# Patient Record
Sex: Female | Born: 1940 | Race: White | Hispanic: No | Marital: Single | Smoking: Never smoker
Health system: Southern US, Community
[De-identification: ages and names within clinical notes are randomized; demographics above are authoritative.]

## PROBLEM LIST (undated history)

## (undated) DIAGNOSIS — I10 Essential (primary) hypertension: Secondary | ICD-10-CM

## (undated) DIAGNOSIS — M1990 Unspecified osteoarthritis, unspecified site: Secondary | ICD-10-CM

## (undated) DIAGNOSIS — G20 Parkinson's disease: Secondary | ICD-10-CM

## (undated) DIAGNOSIS — K219 Gastro-esophageal reflux disease without esophagitis: Secondary | ICD-10-CM

## (undated) DIAGNOSIS — M47815 Spondylosis without myelopathy or radiculopathy, thoracolumbar region: Secondary | ICD-10-CM

## (undated) HISTORY — DX: Gastro-esophageal reflux disease without esophagitis: K21.9

## (undated) HISTORY — PX: PACEMAKER IMPLANT: EP1218

## (undated) HISTORY — DX: Parkinson's disease: G20

## (undated) HISTORY — DX: Unspecified osteoarthritis, unspecified site: M19.90

## (undated) HISTORY — DX: Spondylosis without myelopathy or radiculopathy, thoracolumbar region: M47.815

## (undated) HISTORY — DX: Essential (primary) hypertension: I10

## (undated) HISTORY — PX: APPENDECTOMY: SHX54

## (undated) HISTORY — PX: COLONOSCOPY: SHX174

## (undated) HISTORY — PX: OTHER SURGICAL HISTORY: SHX169

---

## 2010-02-12 ENCOUNTER — Ambulatory Visit: Payer: Self-pay | Admitting: Cardiology

## 2017-01-06 ENCOUNTER — Encounter (INDEPENDENT_AMBULATORY_CARE_PROVIDER_SITE_OTHER): Payer: Self-pay | Admitting: Internal Medicine

## 2017-01-06 ENCOUNTER — Encounter (INDEPENDENT_AMBULATORY_CARE_PROVIDER_SITE_OTHER): Payer: Self-pay

## 2017-01-16 ENCOUNTER — Encounter (INDEPENDENT_AMBULATORY_CARE_PROVIDER_SITE_OTHER): Payer: Self-pay | Admitting: Internal Medicine

## 2017-01-16 ENCOUNTER — Encounter (INDEPENDENT_AMBULATORY_CARE_PROVIDER_SITE_OTHER): Payer: Self-pay

## 2017-01-16 ENCOUNTER — Encounter (INDEPENDENT_AMBULATORY_CARE_PROVIDER_SITE_OTHER): Payer: Self-pay | Admitting: *Deleted

## 2017-01-16 ENCOUNTER — Ambulatory Visit (INDEPENDENT_AMBULATORY_CARE_PROVIDER_SITE_OTHER): Payer: Medicare Other | Admitting: Internal Medicine

## 2017-01-16 VITALS — BP 140/80 | HR 72 | Temp 97.5°F | Ht 61.0 in | Wt 105.2 lb

## 2017-01-16 DIAGNOSIS — K219 Gastro-esophageal reflux disease without esophagitis: Secondary | ICD-10-CM

## 2017-01-16 DIAGNOSIS — R1013 Epigastric pain: Secondary | ICD-10-CM | POA: Diagnosis not present

## 2017-01-16 DIAGNOSIS — M1990 Unspecified osteoarthritis, unspecified site: Secondary | ICD-10-CM

## 2017-01-16 DIAGNOSIS — G20A1 Parkinson's disease without dyskinesia, without mention of fluctuations: Secondary | ICD-10-CM | POA: Insufficient documentation

## 2017-01-16 DIAGNOSIS — G20 Parkinson's disease: Secondary | ICD-10-CM

## 2017-01-16 DIAGNOSIS — M47815 Spondylosis without myelopathy or radiculopathy, thoracolumbar region: Secondary | ICD-10-CM | POA: Insufficient documentation

## 2017-01-16 DIAGNOSIS — I10 Essential (primary) hypertension: Secondary | ICD-10-CM

## 2017-01-16 DIAGNOSIS — M479 Spondylosis, unspecified: Secondary | ICD-10-CM

## 2017-01-16 DIAGNOSIS — G8929 Other chronic pain: Secondary | ICD-10-CM

## 2017-01-16 HISTORY — DX: Essential (primary) hypertension: I10

## 2017-01-16 HISTORY — DX: Spondylosis, unspecified: M47.9

## 2017-01-16 HISTORY — DX: Unspecified osteoarthritis, unspecified site: M19.90

## 2017-01-16 HISTORY — DX: Parkinson's disease without dyskinesia, without mention of fluctuations: G20.A1

## 2017-01-16 HISTORY — DX: Gastro-esophageal reflux disease without esophagitis: K21.9

## 2017-01-16 HISTORY — DX: Parkinson's disease: G20

## 2017-01-16 MED ORDER — PANTOPRAZOLE SODIUM 40 MG PO TBEC: 40.0000 mg | DELAYED_RELEASE_TABLET | Freq: Every day | ORAL | 1 refills | Status: DC

## 2017-01-16 NOTE — Progress Notes (Addendum)
Subjective:    Patient ID: Linda Martinez, female    DOB: 1940/11/14, 76 y.o.   MRN: [id number]  HPI Referred by Jonathon Bellows DO [facility]) for abdominal pain. She points to her epigastric area.  She says she has been hurting for several year, but has increased  She says it feels like a stabbing pain. The pain is constant. She says she had a EGD at [facility] by Dr. Samuella Cota about 6 months. She had a hiatal hernia. She also underwent a colonoscopy in January of this year. She says if she takes a small dose of xanax her epigastric pain resolves. She has lost about 40 pounds over the past year. She says the function of her GB was normal.  Her appetite is terrible because of the pain. She drinks Boost to prevent further weight loss. She usually has BM every 2 days with MOM.  She says she cannot have a BM if she doesn't take MOM. Daughter tells me she also has been seen at [facility] a couple of years ago for same.  A FIB and maintained on Xarelto. Hx of Parkinson's disease  12/31/2016 TSH 1.26, Vitamin D 34.1, H and H 14.0 and 41.6, MCV 94, [place] ct 209 Total protein 6.9, AST 17, ALP 42, total bili 0.8, ALT 12  11/14/2016  Colonoscopy: Colonoscopy: Periumbilical abdominal pain.:  [facility]: Dr. Vivien Rossetti Internal hemorrhoids that do not  Return to the anal canal, thus continuously prolapsed (Grade 1V). Diverticulosis in the sigmoid and ascending colon. Examined ileum was normal.   04/18/2016 EGD: Dr. Samuella Cota: Hiatal hernia. Antral gastritis and small antral polyp. 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   Ct scan of abdomen: 07/20/2016: Gastric wall appears prominent relative to the degree of distention and correlation relative to the possibility of gastritis is recommended. No acute findings involving the abdomen or pelvis. Bilateral peripelvic cysts.  Peripheral nodular density extending from the lower pole of the left kidney, slightly  larger than the prior exam, and incompletely evaluated.  [place] retroperitoneal aorta B scan limited, 99/22/2017: No hydronephrosis. Bilateral increased echogenicity is consistent with medical renal disease. Peripelvic cyst on the rt. Hypoechoic possibly complex cyst involving the lower pole of the left kidney.   Review of Systems Past Medical History:  Diagnosis Date  . Arthritis 01/16/2017  . Essential hypertension, benign 01/16/2017  . GERD (gastroesophageal reflux disease) 01/16/2017  . 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 Parkinson disease (HCC) 01/16/2017    No past surgical history on file.  Allergies  Allergen Reactions  . Tramadol     Throat swell.s    No current outpatient prescriptions on file prior to visit.   No current facility-administered medications on file prior to visit.    Current Outpatient Prescriptions  Medication Sig Dispense Refill  . ALPRAZolam (XANAX) 0.25 MG tablet Take 0.25 mg by mouth at bedtime as needed for anxiety.    . carbidopa-levodopa (SINEMET IR) 25-100 MG tablet Take 1 tablet by mouth 3 (three) times daily.    Marland Kitchen HYDROcodone-acetaminophen (NORCO/VICODIN) 5-325 MG tablet Take 1 tablet by mouth every 6 (six) hours as needed for moderate pain.    Marland Kitchen losartan (COZAAR) 100 MG tablet Take 50 mg by mouth daily.    . ranitidine (ZANTAC) 150 MG capsule Take 150 mg by mouth 2 (two) times daily.    . rivaroxaban (XARELTO) 20 MG TABS tablet Take 20 mg by mouth daily with supper.    . pantoprazole (  PROTONIX) 40 MG tablet Take 1 tablet (40 mg total) by mouth daily. 60 tablet 1   No current facility-administered medications for this visit.        Objective:   Physical Exam Blood pressure 140/80, pulse 72, temperature 97.5 F (36.4 C), height 5\' 1"  (1.549 m), weight 105 lb 3.2 oz (47.7 kg). Alert and oriented. Skin warm and dry. Oral mucosa is moist.   . Sclera anicteric, conjunctivae is pink. Thyroid not enlarged. No cervical lymphadenopathy. Lungs clear. Heart  regular rate and rhythm.  Abdomen is soft. Bowel sounds are positive. No hepatomegaly. No abdominal masses felt. Epigastric tenderness.  No edema to lower extremities.         Assessment & Plan:  Epigastric pain. Rx for Protonix 40mg  BID. US abdomen. CBC, CMET, amylase, Lipase OV in 3 months Will get EGD from Dr. Samuella CotaPandya and Jillyn Hiddenxrays from her PCP.

## 2017-01-16 NOTE — Patient Instructions (Signed)
Stop the Zantac. Labs, and [place]

## 2017-01-17 ENCOUNTER — Encounter (INDEPENDENT_AMBULATORY_CARE_PROVIDER_SITE_OTHER): Payer: Self-pay

## 2017-01-21 ENCOUNTER — Encounter (INDEPENDENT_AMBULATORY_CARE_PROVIDER_SITE_OTHER): Payer: Self-pay

## 2017-04-21 ENCOUNTER — Encounter (INDEPENDENT_AMBULATORY_CARE_PROVIDER_SITE_OTHER): Payer: Self-pay

## 2017-04-21 ENCOUNTER — Encounter (INDEPENDENT_AMBULATORY_CARE_PROVIDER_SITE_OTHER): Payer: Self-pay | Admitting: Internal Medicine

## 2017-04-21 ENCOUNTER — Encounter (INDEPENDENT_AMBULATORY_CARE_PROVIDER_SITE_OTHER): Payer: Self-pay | Admitting: *Deleted

## 2017-04-21 ENCOUNTER — Ambulatory Visit (INDEPENDENT_AMBULATORY_CARE_PROVIDER_SITE_OTHER): Payer: Medicare Other | Admitting: Internal Medicine

## 2017-04-21 VITALS — BP 132/70 | HR 64 | Temp 97.7°F | Ht 61.0 in | Wt 103.4 lb

## 2017-04-21 DIAGNOSIS — R1310 Dysphagia, unspecified: Secondary | ICD-10-CM | POA: Diagnosis not present

## 2017-04-21 DIAGNOSIS — R1013 Epigastric pain: Secondary | ICD-10-CM | POA: Diagnosis not present

## 2017-04-21 DIAGNOSIS — R1319 Other dysphagia: Secondary | ICD-10-CM

## 2017-04-21 DIAGNOSIS — G8929 Other chronic pain: Secondary | ICD-10-CM

## 2017-04-21 NOTE — Patient Instructions (Addendum)
DG esophagram.  I have advised patient to follow up with Dr. Nolen MuMcKinney.

## 2017-04-21 NOTE — Progress Notes (Addendum)
Subjective:    Patient ID: Linda Martinez, female    DOB: [birth year], 76 y.o.   MRN: [id number]  HPI Here today for f/u. She was last seen in March. She was seen for epigastric pain. Has had this pain for years.  She tells me she has epigastric pain every morning.  If she takes Xanax sometimes the pain will resolve usually.   She has taken Norco for the pain at times. Her last weight in March was 105. Today her weight is 103.4.     Her appetite is good.  She usually has a BM  She takes MOM daily for her constipation.  Is having some dysphagia.   [id number] US abdomen: epigastric pain: Medical renal disease.  No acute abdominal pathology   11/14/2016  Colonoscopy: Colonoscopy: Periumbilical abdominal pain.:  [facility]: Dr. Vivien Rossetti Internal hemorrhoids that do not  Return to the anal canal, thus continuously prolapsed (Grade 1V). Diverticulosis in the sigmoid and ascending colon. Examined ileum was normal.   04/18/2016 EGD: Dr. Samuella Cota: Hiatal hernia. Antral gastritis and small antral polyp. 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   Ct scan of abdomen: 07/20/2016: Gastric wall appears prominent relative to the degree of distention and correlation relative to the possibility of gastritis is recommended. No acute findings involving the abdomen or pelvis. Bilateral peripelvic cysts.  Peripheral nodular density extending from the lower pole of the left kidney, slightly larger than the prior exam, and incompletely evaluated.    03/07/2016 HIDA scan: epigastric pain. Normal.  Hx of atrial fib. Has been taken off Xarelto  Review of Systems Past Medical History:  Diagnosis Date  . Arthritis 01/16/2017  . Essential hypertension, benign 01/16/2017  . GERD (gastroesophageal reflux disease) 01/16/2017  . 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 Parkinson disease (HCC) 01/16/2017    Past Surgical History:  Procedure Laterality Date  . APPENDECTOMY    . c  sections     x 3  . COLONOSCOPY    . goiter surgery    . PACEMAKER IMPLANT    . partial hysterectomuy      Allergies  Allergen Reactions  . Tramadol     Throat swell.s    Current Outpatient Prescriptions on File Prior to Visit  Medication Sig Dispense Refill  . ALPRAZolam (XANAX) 0.25 MG tablet Take 0.25 mg by mouth at bedtime as needed for anxiety.    . carbidopa-levodopa (SINEMET IR) 25-100 MG tablet Take 1 tablet by mouth 3 (three) times daily.    Marland Kitchen HYDROcodone-acetaminophen (NORCO/VICODIN) 5-325 MG tablet Take 1 tablet by mouth every 6 (six) hours as needed for moderate pain.    Marland Kitchen losartan (COZAAR) 100 MG tablet Take 50 mg by mouth daily.    . pantoprazole (PROTONIX) 40 MG tablet Take 1 tablet (40 mg total) by mouth daily. 60 tablet 1  . ranitidine (ZANTAC) 150 MG capsule Take 150 mg by mouth 2 (two) times daily.    . rivaroxaban (XARELTO) 20 MG TABS tablet Take 20 mg by mouth daily with supper.     No current facility-administered medications on file prior to visit.         Objective:   Physical Exam Blood pressure 132/70, pulse 64, temperature 97.7 F (36.5 C), height 5\' 1"  (1.549 m), weight 103 lb 6.4 oz (46.9 kg).   Alert and oriented. Skin warm and dry. Oral mucosa is moist.   . Sclera anicteric, conjunctivae is pink. Thyroid not enlarged. No cervical  lymphadenopathy. Lungs clear. Heart regular rate and rhythm.  Abdomen is soft. Bowel sounds are positive. No hepatomegaly. No abdominal masses felt. No tenderness.  No edema to lower extremities        Assessment & Plan:  Epigastric pain. [facility]

## 2017-04-28 ENCOUNTER — Ambulatory Visit (HOSPITAL_COMMUNITY)
Admission: RE | Admit: 2017-04-28 | Discharge: 2017-04-28 | Disposition: A | Discharge status: Home / Self Care | Payer: Medicare Other | Source: Ambulatory Visit | Attending: Internal Medicine | Admitting: Internal Medicine

## 2017-04-28 DIAGNOSIS — K228 Other specified diseases of esophagus: Secondary | ICD-10-CM | POA: Diagnosis not present

## 2017-04-28 DIAGNOSIS — R1310 Dysphagia, unspecified: Secondary | ICD-10-CM | POA: Diagnosis present

## 2017-04-28 DIAGNOSIS — K222 Esophageal obstruction: Secondary | ICD-10-CM | POA: Insufficient documentation

## 2017-04-28 DIAGNOSIS — R1319 Other dysphagia: Secondary | ICD-10-CM

## 2017-05-08 ENCOUNTER — Telehealth (INDEPENDENT_AMBULATORY_CARE_PROVIDER_SITE_OTHER): Payer: Self-pay | Admitting: Internal Medicine

## 2017-05-08 DIAGNOSIS — E079 Disorder of thyroid, unspecified: Secondary | ICD-10-CM

## 2017-05-08 NOTE — Telephone Encounter (Signed)
Ann, US neck ordered.

## 2017-05-09 NOTE — Telephone Encounter (Signed)
US sch'd 05/19/17 at 115, patient aware

## 2017-05-13 ENCOUNTER — Ambulatory Visit (HOSPITAL_COMMUNITY): Payer: Medicare Other

## 2017-05-19 ENCOUNTER — Ambulatory Visit (HOSPITAL_COMMUNITY): Payer: Medicare Other

## 2017-05-22 ENCOUNTER — Ambulatory Visit (HOSPITAL_COMMUNITY)
Admission: RE | Admit: 2017-05-22 | Discharge: 2017-05-22 | Disposition: A | Discharge status: Home / Self Care | Payer: Medicare Other | Source: Ambulatory Visit | Attending: Internal Medicine | Admitting: Internal Medicine

## 2017-05-22 DIAGNOSIS — E079 Disorder of thyroid, unspecified: Secondary | ICD-10-CM

## 2017-05-22 DIAGNOSIS — E890 Postprocedural hypothyroidism: Secondary | ICD-10-CM | POA: Diagnosis not present

## 2017-05-22 DIAGNOSIS — E042 Nontoxic multinodular goiter: Secondary | ICD-10-CM | POA: Insufficient documentation

## 2019-06-13 IMAGING — US US THYROID
1 series · 13 of 25 positions shown · non-contrast
Comparison: Esophagram 04/28/2017

CLINICAL DATA: Extrinsic mass effect on esophagram. Previous goiter
resection 9 years ago.

EXAM:
THYROID ULTRASOUND
TECHNIQUE: Ultrasound examination of the thyroid gland and adjacent soft
tissues was performed.

[Series 1: us thyroid · 0.06mm/px · 13 of 60 slices shown]
[im 1/60]
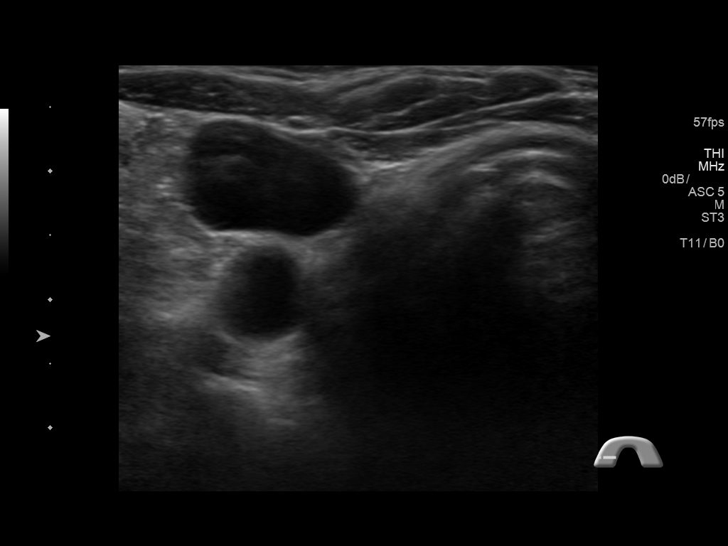
[im 5/60]
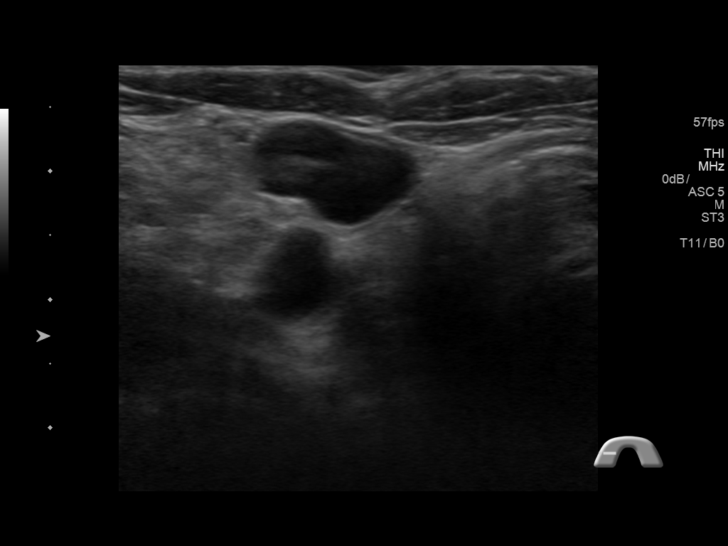
[im 10/60]
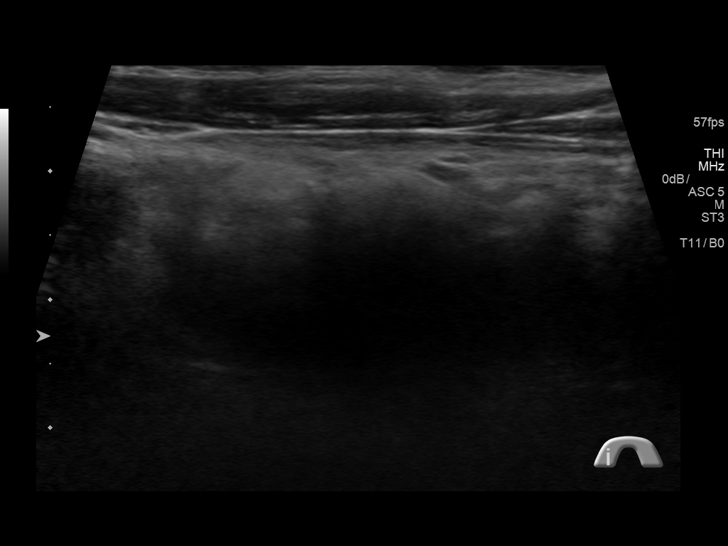
[im 15/60]
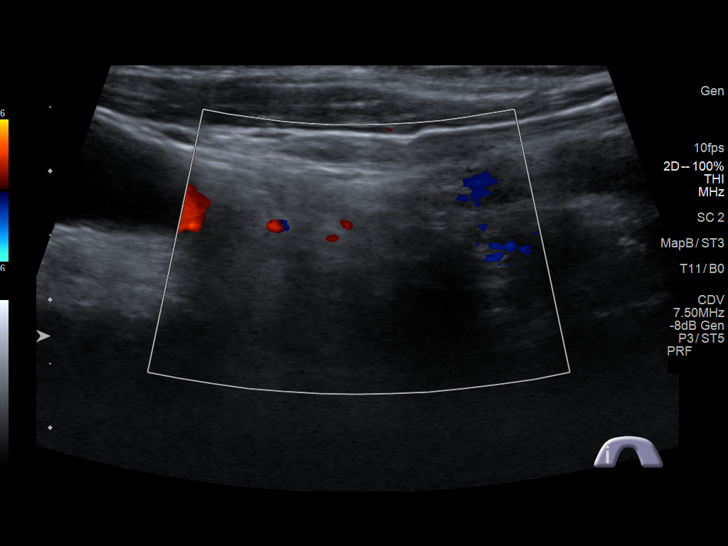
[im 20/60]
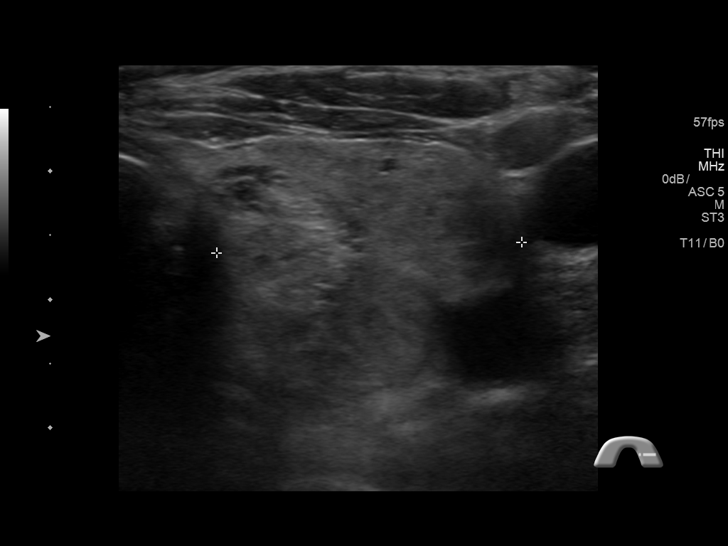
[im 25/60]
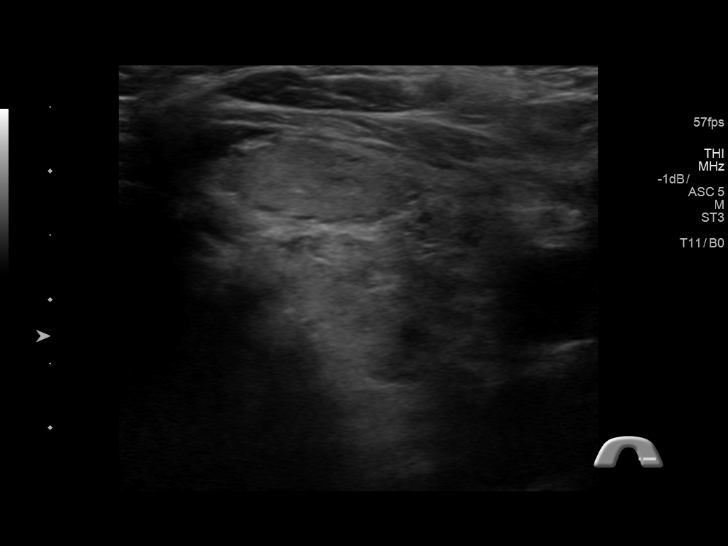
[im 30/60]
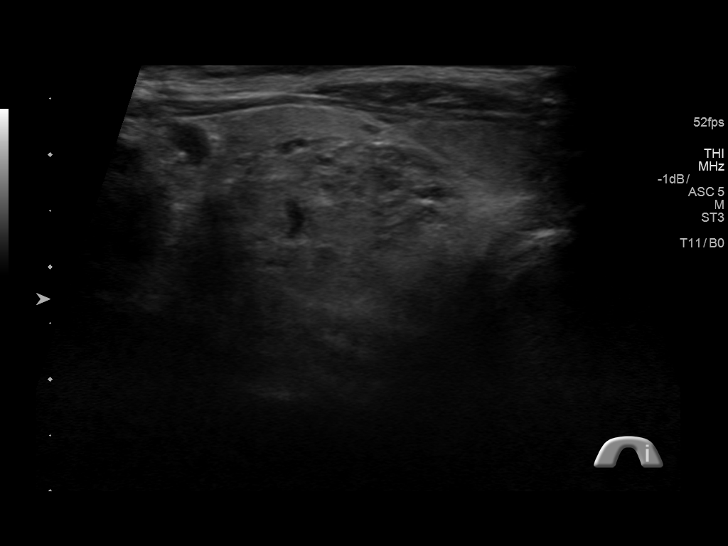
[im 35/60]
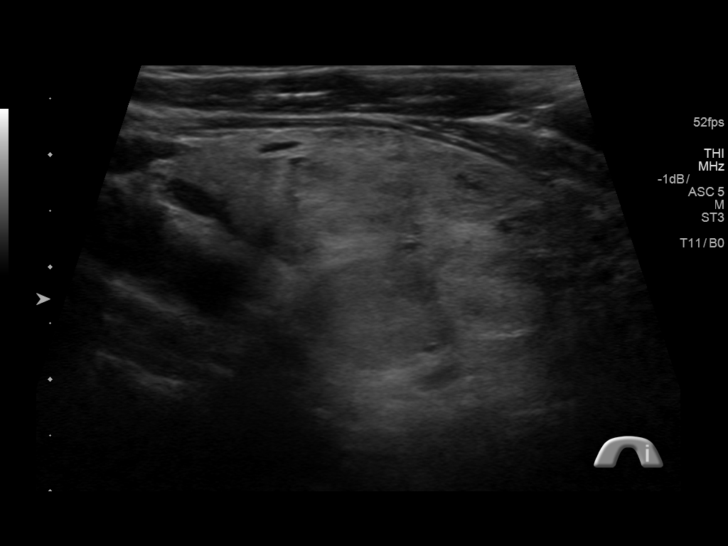
[im 40/60]
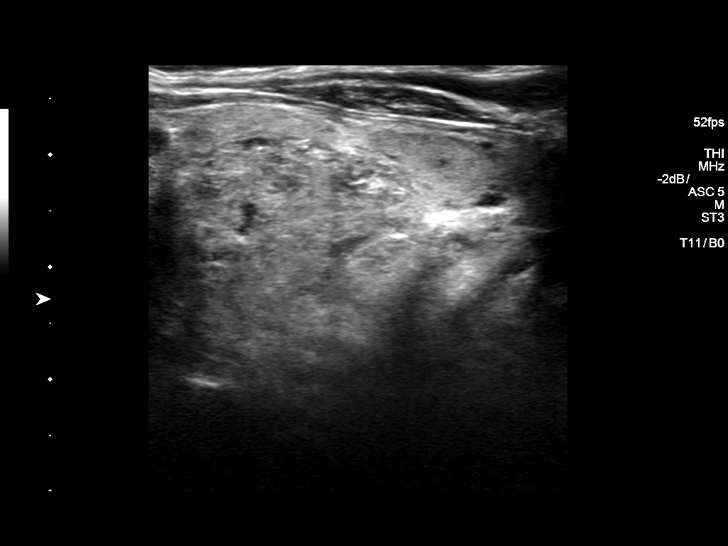
[im 45/60]
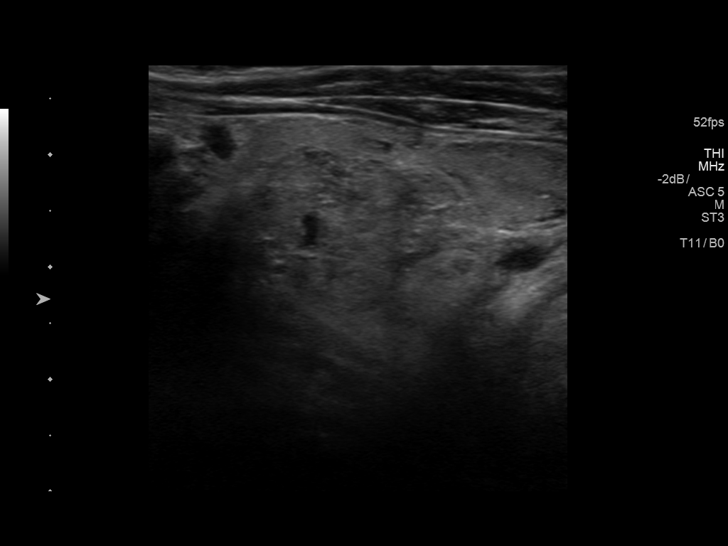
[im 50/60]
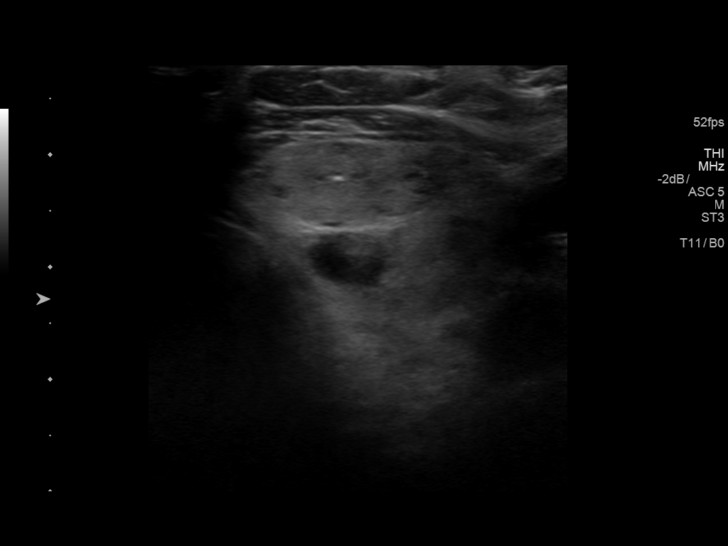
[im 55/60]
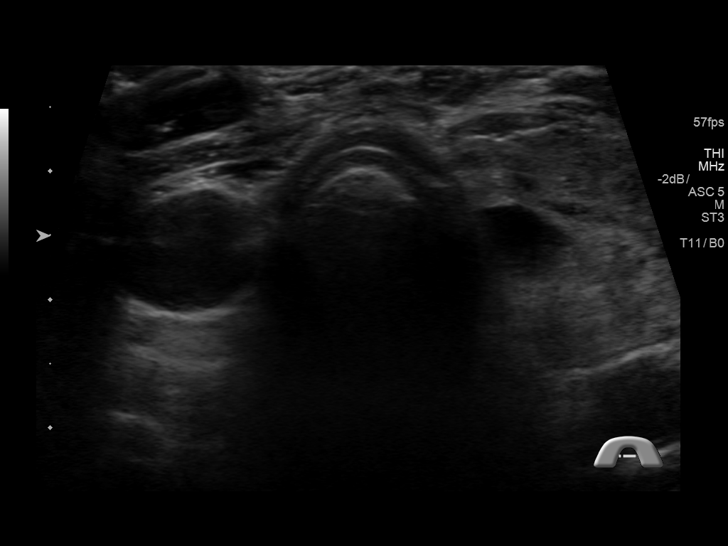
[im 60/60]
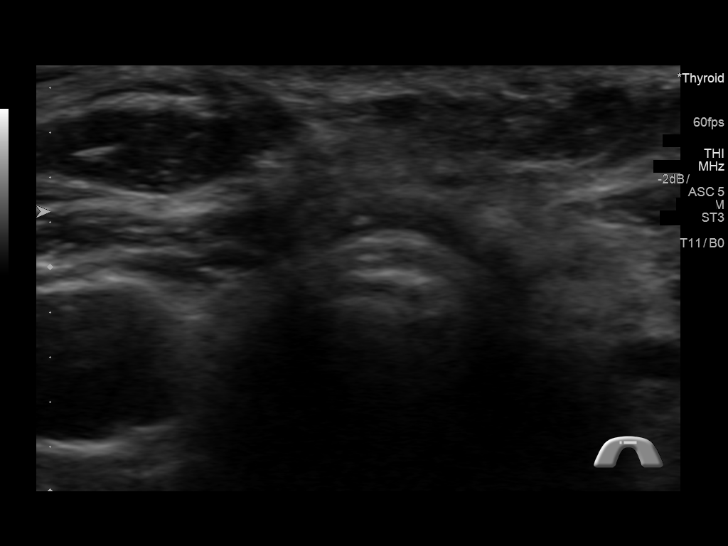

[13 of 25 positions shown; findings below may reference images not displayed]

FINDINGS: Parenchymal Echotexture: Moderately heterogenous

Isthmus: Surgically absent

Right lobe: Surgically absent

Left lobe: 5.2 x 2.7 x 2.4 cm

_________________________________________________________

Estimated total number of nodules >/= 1 cm: 3

Number of spongiform nodules >/=  2 cm not described below (TR1): 0

Number of mixed cystic and solid nodules >/= 1.5 cm not described
below (TR2): 0

_________________________________________________________

Nodule # 1:

Location: Left; Mid

Maximum size: 2 cm; Other 2 dimensions: 1.4 x 0.7 cm

Composition: solid/almost completely solid (2)

Echogenicity: isoechoic (1)

Shape: not taller-than-wide (0)

Margins: ill-defined (0)

Echogenic foci: macrocalcifications (1)

ACR TI-RADS total points: 4.

ACR TI-RADS risk category: TR4 (4-6 points).

ACR TI-RADS recommendations:

**Given size (>/= 1.5 cm) and appearance, fine needle aspiration of
this moderately suspicious nodule should be considered based on
TI-RADS criteria.

_________________________________________________________

Nodule # 2:

Location: Left; Superior

Maximum size: 1.6 cm; Other 2 dimensions: 1.6 x 1 cm

Composition: solid/almost completely solid (2)

Echogenicity: isoechoic (1)

Shape: taller-than-wide (3)

Margins: ill-defined (0)

Echogenic foci: none (0)

ACR TI-RADS total points: 6.

ACR TI-RADS risk category: TR4 (4-6 points).

ACR TI-RADS recommendations:

**Given size (>/= 1.5 cm) and appearance, fine needle aspiration of
this moderately suspicious nodule should be considered based on
TI-RADS criteria.

_________________________________________________________

Nodule # 3:

Location: Left; Mid

Maximum size: 1.5 cm; Other 2 dimensions: 1.2 x 1 cm

Composition: solid/almost completely solid (2)

Echogenicity: isoechoic (1)

Shape: not taller-than-wide (0)

Margins: ill-defined (0)

Echogenic foci: none (0)

ACR TI-RADS total points: 3.

ACR TI-RADS risk category: TR3 (3 points).

ACR TI-RADS recommendations:

*Given size (>/= 1.5 - 2.4 cm) and appearance, a follow-up
ultrasound in 1 year should be considered based on TI-RADS criteria.

_________________________________________________________
IMPRESSION: 1. No residual/recurrent tissue post right hemithyroidectomy.
2. Enlarged left thyroid lobe with nodules.
3. Recommend FNA biopsy of moderately suspicious 2 cm inferior and
1.6 cm superior nodules.
4. Assuming negative cytology, recommend 1 year follow-up ultrasound
of left mid lesion.

The above is in keeping with the ACR TI-RADS recommendations - [HOSPITAL] 8581;[DATE].

## 2021-06-26 ENCOUNTER — Encounter (INDEPENDENT_AMBULATORY_CARE_PROVIDER_SITE_OTHER): Payer: Self-pay | Admitting: *Deleted

## 2021-11-08 ENCOUNTER — Other Ambulatory Visit: Payer: Self-pay

## 2021-11-08 ENCOUNTER — Ambulatory Visit (INDEPENDENT_AMBULATORY_CARE_PROVIDER_SITE_OTHER): Payer: Medicare Other | Admitting: Gastroenterology

## 2021-11-08 ENCOUNTER — Encounter (INDEPENDENT_AMBULATORY_CARE_PROVIDER_SITE_OTHER): Payer: Self-pay | Admitting: Gastroenterology

## 2021-11-08 DIAGNOSIS — G8929 Other chronic pain: Secondary | ICD-10-CM | POA: Diagnosis not present

## 2021-11-08 DIAGNOSIS — R140 Abdominal distension (gaseous): Secondary | ICD-10-CM

## 2021-11-08 DIAGNOSIS — K589 Irritable bowel syndrome without diarrhea: Secondary | ICD-10-CM | POA: Insufficient documentation

## 2021-11-08 DIAGNOSIS — R1013 Epigastric pain: Secondary | ICD-10-CM

## 2021-11-08 DIAGNOSIS — K581 Irritable bowel syndrome with constipation: Secondary | ICD-10-CM

## 2021-11-08 NOTE — Patient Instructions (Addendum)
Schedule CT angio abdomen/pelvis with IV contrast Please bring report of most recent gastric emptying study Continue Miralax for constipation

## 2021-11-08 NOTE — Progress Notes (Signed)
Linda Martinez, M.D. Gastroenterology & Hepatology [facility] [address] [place], [place] [address] Primary Care Physician: Jonathon Bellows, DO [address] [place] [address]  Referring MD: PCP  Chief Complaint: Abdominal bloating and pain  History of Present Illness: Linda Martinez is a 80 y.o. female with PMH Parkinson's disease, GERD, HTN, who presents for evaluation of abdominal bloating and pain.  Patinet reports that for multiple years (at least 40 years), she has presented issues with upper abdominal discomfort and recurrent epigastric distention. She reports that she has noticed the symptoms are worse recently. She states that after having a small meal, she feels that the food stays in her upper abdomen for long time and she gets bloated very easily.  Patient comes to the visit with her daughter who is very concerned about this as her mother is eating much less than she used to in the past.  She reports that she is not eating too much as it causes significant bloating. She has tried avoiding "gassy food" without improvement. She also takes Gas-X frequently without significant relief. Also reports having some pain in the epigastric area after eating. She was prescribed sucralfate in the past but she could not tolerate it.  Due to the symptoms, she has been seen by multiple gastroenterologist at [place], [facility] and at [facility].  Both the daughter and the patient are frustrated as no diagnosis has been attributed to her symptoms for multiple years.  She was actually seen in our office in 2018 due to similar complaints.  She has also presented a chronic history of constipation. She takes Miralax 2 days in a row and skips a day, which helps her have bowel movements every 1-2 days  The patient denies having any nausea, vomiting, fever, chills, hematochezia, melena, hematemesis, diarrhea, jaundice, pruritus.Lost a significant amount of weight  in the past, but has gained it. Daughter states it is related to poor oral intake as it causes pain.  She was scheduled to have a gastric emptying study at [facility] but could not make it as it was a long ride. The daughter reports that she had a GES possibly at  [place] but no reports are available. Is not sure if she took Xifaxan in the past -this was prescribed at [facility] when she saw Dr. Peggye Form in 2020 but there is no documentation whether she took it or not.  Patient takes 1/2 tablet of hydrocodone to 1 pill a day for abdominal pain. She has been taking it for the abdominal pain but not prior to this.  Last EGD: 08/03/2020, performed at [facility] by Dr. 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 Last Colonoscopy: 11/14/2016  Colonoscopy: Colonoscopy: Periumbilical abdominal pain.:  [facility]: Dr. Vivien Rossetti Internal hemorrhoids that do not  Return to the anal canal, thus continuously prolapsed (Grade 1V). Diverticulosis in the sigmoid and ascending colon. Examined ileum was normal.   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 No pertinent family history.  Social  History: Social History   Tobacco Use  Smoking Status Never  Smokeless Tobacco Never   Social History   Substance and Sexual Activity  Alcohol Use No   Social History   Substance and Sexual Activity  Drug Use Never    Allergies: Allergies  Allergen Reactions   Tramadol     Throat swell.s    Medications: Current Outpatient Medications  Medication Sig Dispense Refill   ALPRAZolam (XANAX) 0.25 MG tablet Take 0.25 mg by mouth at bedtime as needed for anxiety.     carbidopa-levodopa (SINEMET IR) 25-100 MG tablet Take 1 tablet by mouth 3 (three) times daily.     HYDROcodone-acetaminophen (NORCO/VICODIN) 5-325 MG tablet Take 1 tablet by mouth every 6 (six) hours as needed for moderate pain.     lisinopril (ZESTRIL) 2.5 MG tablet Take 5 mg by mouth daily.     losartan (COZAAR) 100 MG tablet Take 50 mg by mouth daily.     magnesium hydroxide (MILK OF MAGNESIA) 400 MG/5ML suspension Take 15 mLs by mouth daily as needed for mild constipation.     mirtazapine (REMERON) 15 MG tablet Take 15 mg by mouth at bedtime.     Multiple Vitamins-Minerals (MULTIVITAMIN WITH MINERALS) tablet Take 1 tablet by mouth daily.     polyethylene glycol (MIRALAX / GLYCOLAX) 17 g packet Take 17 g by mouth daily.     prednisoLONE 5 MG TABS tablet Take 5 mg by mouth daily.     Probiotic Product (PROBIOTIC ADVANCED PO) Take by mouth. 1 Gummie daily.     rosuvastatin (CRESTOR) 20 MG tablet Take 20 mg by mouth daily.     sennosides-docusate sodium (SENOKOT-S) 8.6-50 MG tablet Take 1 tablet by mouth daily.     simethicone (MYLICON) 125 MG chewable tablet Chew 125 mg by mouth every 6 (six) hours as needed for flatulence.     No current facility-administered medications for this visit.    Review of Systems: GENERAL: negative for malaise, night sweats HEENT: No changes in hearing or vision, no nose bleeds or other nasal problems. NECK: Negative for lumps, goiter, pain and significant neck swelling RESPIRATORY:  Negative for cough, wheezing CARDIOVASCULAR: Negative for chest pain, leg swelling, palpitations, orthopnea GI: SEE HPI MUSCULOSKELETAL: Negative for joint pain or swelling, back pain, and muscle pain. SKIN: Negative for lesions, rash PSYCH: Negative for sleep disturbance, mood disorder and recent psychosocial stressors. HEMATOLOGY Negative for prolonged bleeding, bruising easily, and swollen nodes. ENDOCRINE: Negative for cold or heat intolerance, polyuria, polydipsia and goiter. NEURO: negative for tremor, gait imbalance, syncope and seizures. The remainder of the review of systems is noncontributory.   Physical Exam: BP 98/65 (BP Location: Left Arm, Patient Position: Sitting, Cuff Size: Small)    Pulse 86    Temp 97.8 F (36.6 C) (Oral)    Ht 5' (1.524 m)    Wt 104 lb 1.6 oz (47.2 kg)    BMI 20.33 kg/m  GENERAL: The patient is AO x3, in no acute distress. Frail. HEENT: Head is normocephalic and atraumatic. EOMI are intact. Mouth is well hydrated and without lesions. NECK: Supple. No  masses LUNGS: Clear to auscultation. No presence of rhonchi/wheezing/rales. Adequate chest expansion HEART: RRR, normal s1 and s2. ABDOMEN: Soft, nontender, no guarding, no peritoneal signs, and nondistended. BS +. No masses. EXTREMITIES: Without any cyanosis, clubbing, rash, lesions or edema. NEUROLOGIC: AOx3, no focal motor deficit. SKIN: no jaundice, no rashes   Imaging/Labs: as above  I personally reviewed and interpreted the available labs, imaging and endoscopic files.  Impression and Plan: Linda Martinez is a 80 y.o. female with PMH Parkinson's disease, GERD, HTN, who presents for evaluation of abdominal bloating and pain.  The patient has presented chronic abdominal distention and pain for multiple decades.  She has had endoscopic investigations in the past that have been unremarkable.  Even though she has lost a significant amount of weight it has remained relatively stable compared to her  weight back in 2018.  I had a thorough discussion with the daughter and the patient about the possibility that her symptoms are related to gastroparesis or irritable bowel syndrome.  I explained that I really wish to evaluate this further with a gastric emptying study of opiates, but the daughter stated that she would like to bring the report of her prior gastric emptying study before we commit to repeat this which I find reasonable.  Also as her symptoms are worse after eating meals, we will need to rule out chronic mesenteric ischemia with a CT angio of the abdomen and pelvis with IV contrast.  Patient understood and agreed.  For now, she will benefit from continuing the use of MiraLAX to relieve her constipation.  - Schedule CT angio abdomen/pelvis with IV contrast - Patient to bring report of most recent gastric emptying study - Continue Miralax for constipation  All questions were answered.      Linda Blazing, MD Gastroenterology and Hepatology [facility]

## 2021-11-09 ENCOUNTER — Encounter (INDEPENDENT_AMBULATORY_CARE_PROVIDER_SITE_OTHER): Payer: Self-pay

## 2021-11-23 ENCOUNTER — Telehealth (INDEPENDENT_AMBULATORY_CARE_PROVIDER_SITE_OTHER): Payer: Self-pay

## 2021-11-23 NOTE — Telephone Encounter (Signed)
error 

## 2021-11-30 ENCOUNTER — Other Ambulatory Visit (INDEPENDENT_AMBULATORY_CARE_PROVIDER_SITE_OTHER): Payer: Self-pay | Admitting: Gastroenterology

## 2021-11-30 DIAGNOSIS — K5904 Chronic idiopathic constipation: Secondary | ICD-10-CM

## 2021-11-30 MED ORDER — LINACLOTIDE 145 MCG PO CAPS: 145.0000 ug | ORAL_CAPSULE | Freq: Every day | ORAL | 3 refills | Status: DC

## 2021-11-30 NOTE — Progress Notes (Signed)
I called the patient's daughter to inform about the results of recent CT of the abdomen and pelvis with IV contrast performed at [facility].  This scan showed presence of large amount of fecal retention but no other abnormalities.  I will prescribe her Linzess 145 micrograms every day.  The patient daughter understood and agreed.  Maylon Peppers, MD Gastroenterology and Hepatology [facility]

## 2021-12-03 ENCOUNTER — Encounter (INDEPENDENT_AMBULATORY_CARE_PROVIDER_SITE_OTHER): Payer: Self-pay

## 2022-02-18 ENCOUNTER — Encounter (INDEPENDENT_AMBULATORY_CARE_PROVIDER_SITE_OTHER): Payer: Self-pay | Admitting: Gastroenterology

## 2022-02-18 ENCOUNTER — Ambulatory Visit (INDEPENDENT_AMBULATORY_CARE_PROVIDER_SITE_OTHER): Payer: Medicare PPO | Admitting: Gastroenterology

## 2022-02-18 VITALS — BP 100/55 | HR 62 | Temp 97.9°F | Ht 60.0 in | Wt 106.7 lb

## 2022-02-18 DIAGNOSIS — R1013 Epigastric pain: Secondary | ICD-10-CM

## 2022-02-18 DIAGNOSIS — K581 Irritable bowel syndrome with constipation: Secondary | ICD-10-CM | POA: Diagnosis not present

## 2022-02-18 NOTE — Progress Notes (Signed)
Linda Martinez, M.D. ?Gastroenterology & Hepatology ?[facility] ?[address] ?[place], [place] [address] ? ?Primary Care Physician: ?Jonathon Bellows, DO ?[address] ?[place] [address] ? ?I will communicate my assessment and recommendations to the referring MD via EMR. ? ?Problems: ?Postprandial abdominal pain ?IBS-C ? ?History of Present Illness: ?Linda Martinez is a 81 y.o. female with PMH Parkinson's disease, GERD, HTN, IBS-C, who presents for follow up of abdominal pain and constipation. ? ?The patient was last seen on 11/08/2021. At that time, the patient was scheduled for a CT angio abdomen and pelvis with IV contrast.  She was also requested to bring the report of the most recent gastric emptying study (I never received this result).  She was also advised to continue taking MiraLAX for constipation. ? ?3 weeks ago she had a fall and had trauma in her arm and had to be hospitalized for this.  Since then, she has been seen in a wheelchair and has been affected in terms of musculoskeletal pain. ? ?Patient had a CT of the abdomen pelvis with IV contrast performed on 11/29/2021 at an outside facility (report of the CT is scanned in the medical chart, no films are available).  This was not an angio protocol.  She had presence of a large amount of fecal retention in her colon but no other alterations. ? ?She is currently taking Linzess 145 mcg qday and Miralax 1 capful every day. She was off the Linzess while she was in the nursing home but she is back on it. She is having one BM every day with this regimen. She is not eating at the moment as she is fearful to have pain after eating.  States that she has pain throughout the day but this is very severe after eating any meal.  Her daughter is concerned that she has been avoiding food intake due to the pain.  She feels bloated frequently. She was on protein supplements but these were stopped at the nursing home as  there was a concern for constipation.  ? ?The patient denies having any nausea, vomiting, fever, chills, hematochezia, melena, hematemesis, jaundice, pruritus. Believes she has been losing weight but weight is similar to her previous appointment. ? ?Last EGD: 08/03/2020, performed at [facility] by Dr. 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 ?Last Colonoscopy: 11/14/2016  for evaluation of Periumbilical abdominal pain.: ? [facility]: Dr. Vivien Rossetti ?Internal hemorrhoids that do not  Return to the anal canal, thus continuously prolapsed (Grade 1V). Diverticulosis in the sigmoid and ascending colon. Examined ileum was normal.  ? ?Past Medical History: ?Past Medical History:  ?Diagnosis Date  ? Arthritis 01/16/2017  ? Essential hypertension, benign 01/16/2017  ? GERD (gastroesophageal reflux disease) 01/16/2017  ? 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 Parkinson disease (HCC) 01/16/2017  ? ? ?Past Surgical History: ?Past Surgical History:  ?Procedure Laterality Date  ? APPENDECTOMY    ? c sections    ? x 3  ? COLONOSCOPY    ? goiter surgery    ? PACEMAKER IMPLANT    ? partial hysterectomuy    ? ? ?Family History:No family history on file. ? ?Social History: ?Social History  ? ?Tobacco Use  ?Smoking Status Never  ? Passive exposure: Never  ?Smokeless Tobacco Never  ? ?Social History  ? ?  Substance and Sexual Activity  ?Alcohol Use No  ? ?Social History  ? ?Substance and Sexual Activity  ?Drug Use Never  ? ? ?Allergies: ?Allergies  ?Allergen Reactions  ? Tramadol   ?  Throat swell.s  ? ? ?Medications: ?Current Outpatient Medications  ?Medication Sig Dispense Refill  ? ALPRAZolam (XANAX) 0.25 MG tablet Take 0.25 mg by mouth at bedtime as needed for anxiety.    ? carbidopa-levodopa (SINEMET IR) 25-100 MG tablet Take 1 tablet by mouth 3 (three) times daily.    ?  HYDROcodone-acetaminophen (NORCO/VICODIN) 5-325 MG tablet Take 1 tablet by mouth every 6 (six) hours as needed for moderate pain.    ? linaclotide (LINZESS) 145 MCG CAPS capsule Take 1 capsule (145 mcg total) by mouth daily before breakfast. 90 capsule 3  ? lisinopril (ZESTRIL) 2.5 MG tablet Take 2.5 mg by mouth daily.    ? magnesium hydroxide (MILK OF MAGNESIA) 400 MG/5ML suspension Take 15 mLs by mouth daily as needed for mild constipation.    ? Multiple Vitamins-Minerals (MULTIVITAMIN WITH MINERALS) tablet Take 1 tablet by mouth daily.    ? polyethylene glycol (MIRALAX / GLYCOLAX) 17 g packet Take 17 g by mouth daily.    ? rosuvastatin (CRESTOR) 20 MG tablet Take 20 mg by mouth daily.    ? simethicone (MYLICON) 125 MG chewable tablet Chew 125 mg by mouth every 6 (six) hours as needed for flatulence.    ? losartan (COZAAR) 100 MG tablet Take 50 mg by mouth daily. (Patient not taking: Reported on 02/18/2022)    ? mirtazapine (REMERON) 15 MG tablet Take 15 mg by mouth at bedtime. (Patient not taking: Reported on 02/18/2022)    ? prednisoLONE 5 MG TABS tablet Take 5 mg by mouth daily. (Patient not taking: Reported on 02/18/2022)    ? Probiotic Product (PROBIOTIC ADVANCED PO) Take by mouth. 1 Gummie daily. (Patient not taking: Reported on 02/18/2022)    ? sennosides-docusate sodium (SENOKOT-S) 8.6-50 MG tablet Take 1 tablet by mouth daily. (Patient not taking: Reported on 02/18/2022)    ? ?No current facility-administered medications for this visit.  ? ? ?Review of Systems: ?GENERAL: negative for malaise, night sweats ?HEENT: No changes in hearing or vision, no nose bleeds or other nasal problems. ?NECK: Negative for lumps, goiter, pain and significant neck swelling ?RESPIRATORY: Negative for cough, wheezing ?CARDIOVASCULAR: Negative for chest pain, leg swelling, palpitations, orthopnea ?GI: SEE HPI ?MUSCULOSKELETAL: Negative for joint pain or swelling, back pain, and muscle pain. ?SKIN: Negative for lesions, rash ?PSYCH:  Negative for sleep disturbance, mood disorder and recent psychosocial stressors. ?HEMATOLOGY Negative for prolonged bleeding, bruising easily, and swollen nodes. ?ENDOCRINE: Negative for cold or heat intolerance, polyuria, polydipsia and goiter. ?NEURO: negative for tremor, gait imbalance, syncope and seizures. ?The remainder of the review of systems is noncontributory. ? ? ?Physical Exam: ?BP (!) 100/55 (BP Location: Right Arm, Patient Position: Sitting, Cuff Size: Normal)   Pulse 62   Temp 97.9 ?F (36.6 ?C) (Oral)   Ht 5' (1.524 m)   Wt 106 lb 11.2 oz (48.4 kg)   BMI 20.84 kg/m?  ?GENERAL: The patient is AO x3, in no acute distress. Sitting in wheelchair ?HEENT: Head is normocephalic and atraumatic. EOMI are intact. Mouth is well hydrated and without lesions. ?NECK: Supple. No masses ?LUNGS: Clear to auscultation. No presence of rhonchi/wheezing/rales. Adequate chest expansion ?HEART: RRR, normal s1 and s2. ?ABDOMEN: mildly tender in the epigastric area, no guarding, no peritoneal signs, and nondistended. BS +. No  masses. ?EXTREMITIES: Without any cyanosis, clubbing, rash, lesions or edema. ?NEUROLOGIC: AOx3, no focal motor deficit. ?SKIN: no jaundice, no rashes ? ?Imaging/Labs: ?as above ? ?I personally reviewed and interpreted the available labs, imaging and endoscopic files. ? ?Impression and Plan: ?Linda Martinez is a 81 y.o. female with PMH Parkinson's disease, GERD, HTN, IBS-C, who presents for follow up of abdominal pain and constipation.  The patient has presented persistent constipation since the last visit.  It is concerning that she has been avoiding food intake due to this.  Unfortunately, even though I ordered a CT angio in her last appointment, the imaging performed at an outside facility was a regular CT with IV contrast.  This will not help evaluating for chronic mesenteric ischemia.  I discussed this with the patient and the daughter and reinforced the importance of proceeding with this  specific imaging test.  The understood and agreed to repeat the CT scan.  She we will need to bring the report of the gastric emptying study as this has not been available for me to review.  If these investigations are [place]

## 2022-02-18 NOTE — Patient Instructions (Addendum)
Schedule CT angio abdomen/pelvis with IV contrast ?Continue Linzess 145 mcg qday ?Continue Miralax daily ?May consider EGD if unremarkable CT ?

## 2022-03-07 ENCOUNTER — Telehealth (INDEPENDENT_AMBULATORY_CARE_PROVIDER_SITE_OTHER): Payer: Self-pay

## 2022-03-07 NOTE — Telephone Encounter (Signed)
Daughter aware and plans on bringing the film on Mar 11, 2022. ?

## 2022-03-07 NOTE — Telephone Encounter (Signed)
Had CT angio done 03/06/2022 at [facility] has the disc and does not want to just drive it up here without an appointment please advise. Can you see the results through Care everywhere? ?

## 2022-03-07 NOTE — Telephone Encounter (Signed)
Unfortunately I cannot access neither the report nor the imaging films.  I will need both for further evaluation of her symptoms ?

## 2022-03-20 ENCOUNTER — Telehealth (INDEPENDENT_AMBULATORY_CARE_PROVIDER_SITE_OTHER): Payer: Self-pay

## 2022-03-20 NOTE — Telephone Encounter (Signed)
Patient was told to keep the 05/20/2022 appt and cancel the 03/21/2022 appt with [facility] [place]. Patient daughter aware. ?

## 2022-03-20 NOTE — Telephone Encounter (Signed)
I reviewed the imaging of CT angio of the abdomen and pelvis with IV contrast with Dr. 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  I explained to the daughter that she should follow-up with GYN.   ?As she is presenting significant persistent pain, we may proceed with an EGD and colonoscopy.  However the daughter stated that she is not interested in proceeding with this at this moment I will hold off for now.  She would like to keep the July appointment for now. ?

## 2022-03-20 NOTE — Telephone Encounter (Signed)
Daughter Lebron Conners [phone] called today says her mother is still having the same abdominal pain and they understand they found nothing on the CT scan. They want to know what the next steps are. They had an appointment set up for 05/20/2022,but wanted to be seen sooner. We had an opening for 03/20/2022 at 1:45 pm and they took that time slot. She also still has the ct scan scheduled on 04/01/2022, does the patient need to still get this done? Please advise.  ?

## 2022-03-21 ENCOUNTER — Ambulatory Visit (INDEPENDENT_AMBULATORY_CARE_PROVIDER_SITE_OTHER): Payer: Medicare PPO | Admitting: Gastroenterology

## 2022-04-01 ENCOUNTER — Ambulatory Visit (HOSPITAL_COMMUNITY): Payer: Medicare PPO

## 2022-05-20 ENCOUNTER — Ambulatory Visit (INDEPENDENT_AMBULATORY_CARE_PROVIDER_SITE_OTHER): Payer: Medicare PPO | Admitting: Gastroenterology

## 2022-05-20 ENCOUNTER — Encounter (INDEPENDENT_AMBULATORY_CARE_PROVIDER_SITE_OTHER): Payer: Self-pay | Admitting: Gastroenterology

## 2022-05-20 VITALS — BP 152/97 | HR 75 | Ht 60.0 in | Wt 97.4 lb

## 2022-05-20 DIAGNOSIS — R1013 Epigastric pain: Secondary | ICD-10-CM

## 2022-05-20 DIAGNOSIS — R140 Abdominal distension (gaseous): Secondary | ICD-10-CM | POA: Diagnosis not present

## 2022-05-20 DIAGNOSIS — G20 Parkinson's disease: Secondary | ICD-10-CM

## 2022-05-20 DIAGNOSIS — K581 Irritable bowel syndrome with constipation: Secondary | ICD-10-CM | POA: Diagnosis not present

## 2022-05-20 MED ORDER — LINACLOTIDE 290 MCG PO CAPS
290.0000 ug | ORAL_CAPSULE | Freq: Every day | ORAL | 3 refills | Status: DC
Start: 2022-05-20 — End: 2022-12-02

## 2022-05-20 NOTE — Patient Instructions (Addendum)
Increase Linzess 290 mcg qday Can take Miralax as needed if still constipated despite taking Linzess Stop peptobismol and milk of magnesia Take Gas-X as needed Liberalize diet and take Boost or Ensure supplements at least twice a day

## 2022-05-20 NOTE — Progress Notes (Signed)
Katrinka Blazing, M.D. Gastroenterology & Hepatology [facility] [address] [place], [place] [address]  Primary Care Physician: Jonathon Bellows, DO [address] [place] [address]  I will communicate my assessment and recommendations to the referring MD via EMR.  Problems: Postprandial abdominal pain IBS-C  History of Present Illness: Linda Martinez is a 81 y.o.  female with PMH Parkinson's disease, GERD, HTN, IBS-C, who presents for follow up of abdominal pain and constipation.  The patient was last seen on 02/18/2022. At that time, the patient was scheduled to undergo a CT angio of the abdomen and pelvis with IV contrast.  She was continued on Linzess 145 mcg every day and MiraLAX daily. 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  I advised them to follow-up with GYN for this.  The patient did not want to pursue an EGD and colonoscopy as she has had this in the past, so conservative management was pursued.  She reports that she is presenting recurrent episodes of pain in her abdomen in the periumbilical area and in the RUQ. She reports that if she does not eat, the pain goes away. She also reports having pain in the left side of her abdomen.She also feels she has improvement of her pain when she moves her bowels. Denies any melena or hematochezia. She fels occasionally nauseated but no vomiting.  She is currently taking Linzess 145 mcg qday in the AM. She has never been on higher doses in the past. Sometimes she takes Miralax as she feels she has not emptied completely despite taking the Linzess compliantly. Very occasionally takes milk of magnesia and Peptobismol. Feels bloated constantly.  She reports feeling very weak and fatigued frequently.  She has not seen her GYN as her daughter thought I was talking about an  old cyst and not about an ovarian artery thrombosis.  The patient denies having any nausea, vomiting, fever, chills, hematochezia, melena, hematemesis,  diarrhea, jaundice, pruritus. Has lost 9 lb since the last time she was seen in the office  Last EGD: 08/03/2020, performed at [facility] by Dr. 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  Last Colonoscopy: 11/14/2016  for evaluation of Periumbilical abdominal pain.:  [facility]: Dr. Vivien Rossetti Internal hemorrhoids that do not  Return to the anal canal, thus continuously prolapsed (Grade 1V). Diverticulosis in the sigmoid and ascending colon. Examined ileum was normal.   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 No pertinent family history.  Social History: Social History   Tobacco Use  Smoking Status Never   Passive exposure: Never  Smokeless Tobacco Never   Social History   Substance and Sexual Activity  Alcohol Use No  Social History   Substance and Sexual Activity  Drug Use Never    Allergies: Allergies  Allergen Reactions   Tramadol     Throat swell.s    Medications: Current Outpatient Medications  Medication Sig Dispense Refill   ALPRAZolam (XANAX) 0.25 MG tablet Take 0.25 mg by mouth at bedtime as needed for anxiety.     bismuth subsalicylate (PEPTO BISMOL) 262 MG/15ML suspension Take 30 mLs by mouth every 6 (six)  hours as needed.     carbidopa-levodopa (SINEMET IR) 25-100 MG tablet Take 1 tablet by mouth 3 (three) times daily.     HYDROcodone-acetaminophen (NORCO/VICODIN) 5-325 MG tablet Take 1 tablet by mouth every 6 (six) hours as needed for moderate pain.     linaclotide (LINZESS) 145 MCG CAPS capsule Take 1 capsule (145 mcg total) by mouth daily before breakfast. 90 capsule 3   polyethylene glycol (MIRALAX / GLYCOLAX) 17 g packet Take 17 g by mouth daily.     Probiotic Product (PROBIOTIC ADVANCED PO) Take by mouth. 1 Gummie daily.     rosuvastatin (CRESTOR) 20 MG tablet Take 20 mg by mouth daily.     simethicone (MYLICON) 125 MG chewable tablet Chew 125 mg by mouth every 6 (six) hours as needed for flatulence.     No current facility-administered medications for this visit.    Review of Systems: GENERAL: negative for malaise, night sweats HEENT: No changes in hearing or vision, no nose bleeds or other nasal problems. NECK: Negative for lumps, goiter, pain and significant neck swelling RESPIRATORY: Negative for cough, wheezing CARDIOVASCULAR: Negative for chest pain, leg swelling, palpitations, orthopnea GI: SEE HPI MUSCULOSKELETAL: Negative for joint pain or swelling, back pain, and muscle pain. SKIN: Negative for lesions, rash PSYCH: Negative for sleep disturbance, mood disorder and recent psychosocial stressors. HEMATOLOGY Negative for prolonged bleeding, bruising easily, and swollen nodes. ENDOCRINE: Negative for cold or heat intolerance, polyuria, polydipsia and goiter. NEURO: negative for tremor, gait imbalance, syncope and seizures. The remainder of the review of systems is noncontributory.   Physical Exam: BP (!) 152/97 (BP Location: Right Arm, Patient Position: Sitting, Cuff Size: Small)   Pulse 75   Ht 5' (1.524 m)   Wt 97 lb 6.4 oz (44.2 kg)   BMI 19.02 kg/m  GENERAL: The patient is AO x3, in no acute distress. Underweight. HEENT: Head is normocephalic and atraumatic. EOMI  are intact. Mouth is well hydrated and without lesions. NECK: Supple. No masses LUNGS: Clear to auscultation. No presence of rhonchi/wheezing/rales. Adequate chest expansion HEART: RRR, normal s1 and s2. ABDOMEN: Soft, nontender, no guarding, no peritoneal signs, and nondistended. BS +. No masses. EXTREMITIES: Without any cyanosis, clubbing, rash, lesions or edema. NEUROLOGIC: AOx3, no focal motor deficit. SKIN: no jaundice, no rashes  Imaging/Labs: as above  I personally reviewed and interpreted the available labs, imaging and endoscopic files.  Impression and Plan: Linda Martinez is a 81 y.o.  female with PMH Parkinson's disease, GERD, HTN, IBS-C, who presents for follow up of abdominal pain and constipation.  The patient underwent recent CT angio of the abdomen and pelvis with IV contrast that did not show any abnormalities that would explain her postprandial pain and weight loss.  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  The patient is not interested in pursuing any further endoscopic evaluations at this moment.  Due to this, given the absence  of abnormalities in her most recent investigations, we can consider her symptoms are possibly related to significant IBS-C for which she will benefit from increasing her Linzess dose to 290 mcg/day and take MiraLAX as needed if persistent constipation.  She can also liberalize her diet and take protein shakes to improve her weight.  Although the daughter and the patient understood and agreed.  - Increase Linzess 290 mcg qday - Can take Miralax as needed if still constipated despite taking Linzess - Stop peptobismol and milk of magnesia - Take Gas-X as needed - Liberalize diet and take Boost or Ensure supplements at least twice a day  All questions were answered.      Dolores Frame, MD Gastroenterology and  Hepatology [facility]

## 2022-07-18 ENCOUNTER — Telehealth (INDEPENDENT_AMBULATORY_CARE_PROVIDER_SITE_OTHER): Payer: Self-pay | Admitting: *Deleted

## 2022-07-18 NOTE — Telephone Encounter (Signed)
Tabitha from [facility] center called to get copy of CT report that was done in April of this year. I do not see where scan was done but Dr. Wilburt Finlay note from 05/20/22 mentioned the results. See below:  The patient underwent recent CT angio of the abdomen and pelvis with IV contrast that did not show any abnormalities that would explain her postprandial pain and weight loss.  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 Jonathon Bellows office said this note was sent to them and they were trying to get her referred to gyn and need copy of CT scan. She asked patient where it was done and she does not remember. I do not see in chart either but Dr. Levon Hedger reviewed it according to his note.   If you can find it she would like a copy faxed to fax number [phone]. She said she also faxed over a request for this after she left me a voicemail.   Tabitha's phone number [phone].

## 2022-07-18 NOTE — Telephone Encounter (Signed)
Called [facility] and they are faxing the CT Angio Chest and A/P reports to [place]

## 2022-07-18 NOTE — Telephone Encounter (Signed)
Thanks, I called Tabitha and let her know you sent over copy.

## 2022-07-23 NOTE — Telephone Encounter (Signed)
error 

## 2022-08-29 ENCOUNTER — Encounter (INDEPENDENT_AMBULATORY_CARE_PROVIDER_SITE_OTHER): Payer: Self-pay | Admitting: Gastroenterology

## 2022-09-19 ENCOUNTER — Ambulatory Visit (INDEPENDENT_AMBULATORY_CARE_PROVIDER_SITE_OTHER): Payer: Medicare PPO | Admitting: Gastroenterology

## 2022-09-26 ENCOUNTER — Ambulatory Visit (INDEPENDENT_AMBULATORY_CARE_PROVIDER_SITE_OTHER): Payer: Medicare PPO | Admitting: Gastroenterology

## 2022-12-02 ENCOUNTER — Ambulatory Visit (INDEPENDENT_AMBULATORY_CARE_PROVIDER_SITE_OTHER): Payer: Medicare PPO | Admitting: Gastroenterology

## 2022-12-02 ENCOUNTER — Encounter (INDEPENDENT_AMBULATORY_CARE_PROVIDER_SITE_OTHER): Payer: Self-pay | Admitting: Gastroenterology

## 2022-12-02 VITALS — BP 126/67 | HR 69 | Temp 97.8°F | Ht 60.0 in

## 2022-12-02 DIAGNOSIS — G20A1 Parkinson's disease without dyskinesia, without mention of fluctuations: Secondary | ICD-10-CM | POA: Diagnosis not present

## 2022-12-02 DIAGNOSIS — R1013 Epigastric pain: Secondary | ICD-10-CM | POA: Diagnosis not present

## 2022-12-02 DIAGNOSIS — K581 Irritable bowel syndrome with constipation: Secondary | ICD-10-CM | POA: Diagnosis not present

## 2022-12-02 DIAGNOSIS — R140 Abdominal distension (gaseous): Secondary | ICD-10-CM | POA: Diagnosis not present

## 2022-12-02 MED ORDER — LINACLOTIDE 290 MCG PO CAPS: 290.0000 ug | ORAL_CAPSULE | Freq: Every day | ORAL | 3 refills | Status: DC

## 2022-12-02 MED ORDER — HYOSCYAMINE SULFATE 0.125 MG SL SUBL: 0.1250 mg | SUBLINGUAL_TABLET | Freq: Three times a day (TID) | SUBLINGUAL | 1 refills | Status: DC | PRN

## 2022-12-02 NOTE — Patient Instructions (Signed)
Continue Linzess 290 mcg qday Continue with Miralax as needed for constipation Start Levsin 1 tablet q8h as needed for abdominal pain Liberalize diet

## 2022-12-02 NOTE — Progress Notes (Signed)
Maylon Peppers, M.D. Gastroenterology & Hepatology [facility] [address] [place], [place] [address]  Primary Care Physician: Sherrilee Gilles, [place] [address]  I will communicate my assessment and recommendations to the referring MD via EMR.  Problems: Postprandial abdominal pain IBS-C   History of Present Illness: Linda Martinez is a 82 y.o.  female with PMH Parkinson's disease, GERD, HTN, IBS-C, who presents for follow up of abdominal pain and constipation.  The patient was last seen on 05/20/22. At that time, the patient was advised to increase Linzess to 290 mcg every day and to add MiraLAX to her regimen if presenting persistent constipation.  Also advised to take Gas-X as needed and to liberalize her diet if possible.  Patient reports that she is presenting intermittent episodes of pain in the epigastric area despite taking the Linzess compliantly. She is having a bowel movement every day at least once a day, which did not work when she was off [place]. She feels nauseated frequently but does not vomit.  The patient denies having any fever, chills, hematochezia, melena, hematemesis, abdominal distention, abdominal pain, diarrhea, jaundice, pruritus. She believes she has lost some weight as she is not hungry and has been eating very little.  Family and patient are not interested in endoscopic investigations given poor functionality.  Last EGD: 08/03/2020, performed at [facility] by Dr. 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   Last Colonoscopy: 11/14/2016  for evaluation of Periumbilical abdominal pain.:  [facility]: Dr. Windell Moment Internal hemorrhoids that do not  Return to the anal canal, thus continuously prolapsed (Grade 1V). Diverticulosis in the sigmoid  and ascending colon. Examined ileum was normal.   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 No pertinent family history.  Social History: Social History   Tobacco Use  Smoking Status Never   Passive exposure: Never  Smokeless Tobacco Never   Social History   Substance and Sexual Activity  Alcohol Use No   Social History   Substance and Sexual Activity  Drug Use Never    Allergies: Allergies  Allergen Reactions   Tramadol     Throat swell.s    Medications: Current Outpatient Medications  Medication Sig Dispense Refill   ALPRAZolam (XANAX) 0.25 MG tablet Take 0.25 mg by mouth at bedtime as needed for anxiety.     carbidopa-levodopa (SINEMET IR) 25-100 MG tablet Take 1 tablet by mouth 3 (three) times daily.     HYDROcodone-acetaminophen (NORCO/VICODIN) 5-325 MG tablet Take 1 tablet by mouth every 6 (six) hours as needed for moderate pain.     polyethylene glycol (MIRALAX / GLYCOLAX) 17 g packet Take 17 g by mouth daily.     bismuth subsalicylate (PEPTO BISMOL) 262 MG/15ML suspension Take 30 mLs by mouth every 6 (six) hours as  needed. (Patient not taking: Reported on 12/02/2022)     linaclotide (LINZESS) 290 MCG CAPS capsule Take 1 capsule (290 mcg total) by mouth daily before breakfast. (Patient not taking: Reported on 12/02/2022) 90 capsule 3   Probiotic Product (PROBIOTIC ADVANCED PO) Take by mouth. 1 Gummie daily.     rosuvastatin (CRESTOR) 20 MG tablet Take 20 mg by mouth daily.     simethicone (MYLICON) 824 MG chewable tablet Chew 125  mg by mouth every 6 (six) hours as needed for flatulence.     No current facility-administered medications for this visit.    Review of Systems: GENERAL: negative for malaise, night sweats HEENT: No changes in hearing or vision, no nose bleeds or other nasal problems. NECK: Negative for lumps, goiter, pain and significant neck swelling RESPIRATORY: Negative for cough, wheezing CARDIOVASCULAR: Negative for chest pain, leg swelling, palpitations, orthopnea GI: SEE HPI MUSCULOSKELETAL: Negative for joint pain or swelling, back pain, and muscle pain. SKIN: Negative for lesions, rash PSYCH: Negative for sleep disturbance, mood disorder and recent psychosocial stressors. HEMATOLOGY Negative for prolonged bleeding, bruising easily, and swollen nodes. ENDOCRINE: Negative for cold or heat intolerance, polyuria, polydipsia and goiter. NEURO: negative for tremor, gait imbalance, syncope and seizures. The remainder of the review of systems is noncontributory.   Physical Exam: BP 126/67 (BP Location: Left Arm, Patient Position: Sitting, Cuff Size: Small)   Pulse 69   Temp 97.8 F (36.6 C) (Temporal)   Ht 5' (1.524 m)   BMI 19.02 kg/m  GENERAL: The patient is AO x3, in no acute distress. HEENT: Head is normocephalic and atraumatic. EOMI are intact. Mouth is well hydrated and without lesions. NECK: Supple. No masses LUNGS: Clear to auscultation. No presence of rhonchi/wheezing/rales. Adequate chest expansion HEART: RRR, normal s1 and s2. ABDOMEN: Soft, nontender, no guarding, no peritoneal signs, and nondistended. BS +. No masses. RECTAL EXAM: no external lesions, normal tone, no masses, brown stool without blood.*** Chaperone: EXTREMITIES: Without any cyanosis, clubbing, rash, lesions or edema. NEUROLOGIC: AOx3, no focal motor deficit. SKIN: no jaundice, no rashes  Imaging/Labs: as above  I personally reviewed and interpreted the available labs, imaging and endoscopic  files.  Impression and Plan: Linda Martinez is a 82 y.o. female coming for follow up of ***  Interaction with gabapentin and mirtazapine  All questions were answered.      Maylon Peppers, MD Gastroenterology and Hepatology [facility]

## 2023-06-02 ENCOUNTER — Ambulatory Visit (INDEPENDENT_AMBULATORY_CARE_PROVIDER_SITE_OTHER): Payer: Medicare PPO | Admitting: Gastroenterology

## 2023-06-02 ENCOUNTER — Encounter (INDEPENDENT_AMBULATORY_CARE_PROVIDER_SITE_OTHER): Payer: Self-pay | Admitting: Gastroenterology

## 2023-06-02 DIAGNOSIS — K581 Irritable bowel syndrome with constipation: Secondary | ICD-10-CM

## 2023-06-02 DIAGNOSIS — R140 Abdominal distension (gaseous): Secondary | ICD-10-CM | POA: Diagnosis not present

## 2023-06-02 DIAGNOSIS — R1013 Epigastric pain: Secondary | ICD-10-CM | POA: Diagnosis not present

## 2023-06-02 MED ORDER — HYOSCYAMINE SULFATE 0.125 MG SL SUBL
0.1250 mg | SUBLINGUAL_TABLET | Freq: Three times a day (TID) | SUBLINGUAL | 1 refills | Status: DC | PRN
Start: 2023-06-02 — End: 2023-12-04

## 2023-06-02 MED ORDER — LINACLOTIDE 290 MCG PO CAPS
290.0000 ug | ORAL_CAPSULE | Freq: Every day | ORAL | 3 refills | Status: DC
Start: 2023-06-02 — End: 2024-06-03

## 2023-06-02 MED ORDER — POLYETHYLENE GLYCOL 3350 17 G PO PACK
17.0000 g | PACK | Freq: Every day | ORAL | 5 refills | Status: AC
Start: 2023-06-02 — End: ?

## 2023-06-02 NOTE — Patient Instructions (Signed)
Restart taking Levsin (hyoscyamine) every 8 hours as needed for abdominal pain Continue Linzess 290 mcg qday Start taking Miralax 1 capful every day

## 2023-06-02 NOTE — Progress Notes (Signed)
Katrinka Blazing, M.D. Gastroenterology & Hepatology [facility] [address] [place], [place] [address]  Primary Care Physician: Jonathon Bellows, DO [address] [place] [address]  I will communicate my assessment and recommendations to the referring MD via EMR.  Problems: Postprandial abdominal pain IBS-C   History of Present Illness: Linda Martinez is a 82 y.o.  female with PMH Parkinson's disease, GERD, HTN, IBS-C, who presents for follow up of abdominal pain and constipation.  The patient was last seen on 12/02/2022. At that time, the patient was continued on Linzess 290 mcg daily I will MiraLAX as needed.  Was prescribed Levsin as needed for abdominal pain and was advised to liberalize diet.  Patient was not interested in pursuing any repeat endoscopic evaluation.  In September 2023, the patient previously underwent CT angio of the abdomen and pelvis with IV contrast that did not show any abnormalities that would explain her postprandial pain and weight loss.  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   Patient reports that she tried using Levsin in the past but she does not believe that it helps for her pain. Upon further questioning, it is unclear if she is taking Levsin or not, or if it even causes symptoms improvement. Initially she did not remember taking it but then she said that it possibly helped. She reports that she has decreased her food intake as she is concerned this may cause more abdominal ain and this led to 4 lb loss. Patient reports that her medications cause significant discomfort. She reports having significant pain in her abdomen, which she believes gets better after she has a bowel movement. Daughter says she is frustrated as some days she moves her bowels too many times after taking Miralax and she complains of having multiple bowle  movmements. Daughter does not know when she should take the Miralax.  Patient takes Peptobismol as needed for nausea control.  Last EGD: 08/03/2020, performed at [facility] by Dr. 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   Last Colonoscopy: 11/14/2016  for evaluation of Periumbilical abdominal pain:  [facility]: Dr. Vivien Rossetti Internal hemorrhoids that do not  Return to the anal canal, thus continuously prolapsed (Grade 1V). Diverticulosis in the sigmoid and ascending colon. Examined ileum was normal.   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 No pertinent family history.  Social History: Social History   Tobacco Use  Smoking Status Never   Passive exposure: Never  Smokeless Tobacco Never   Social History   Substance and Sexual Activity  Alcohol Use No   Social History   Substance and Sexual Activity  Drug Use Never    Allergies: Allergies  Allergen Reactions   Tramadol  Throat swell.s    Medications: Current Outpatient Medications  Medication Sig Dispense Refill   albuterol (VENTOLIN HFA) 108 (90 Base) MCG/ACT inhaler Inhale 2 puffs into the lungs every 4 (four) hours as needed.     ALPRAZolam (XANAX) 0.5 MG tablet Take 0.25 mg by mouth at bedtime as needed for anxiety.     carbidopa-levodopa (SINEMET IR) 25-100 MG tablet Take 1 tablet by mouth 3 (three) times daily.      citalopram (CELEXA) 10 MG tablet Take 10 mg by mouth daily.     HYDROcodone-acetaminophen (NORCO/VICODIN) 5-325 MG tablet Take 1 tablet by mouth every 6 (six) hours as needed for moderate pain.     hyoscyamine (LEVSIN SL) 0.125 MG SL tablet Place 1 tablet (0.125 mg total) under the tongue every 8 (eight) hours as needed (abdominal pain). 90 tablet 1   linaclotide (LINZESS) 290 MCG CAPS capsule Take 1 capsule (290 mcg total) by mouth daily before breakfast. 90 capsule 3   Melatonin 5 MG CHEW Chew by mouth as needed.     Multiple Vitamin (MULTIVITAMIN) capsule Take 1 capsule by mouth daily.     OVER THE COUNTER MEDICATION Lisinopril prn high bp per daughter (mg unknown)     polyethylene glycol (MIRALAX / GLYCOLAX) 17 g packet Take 17 g by mouth daily.     prednisoLONE 5 MG TABS tablet Take 5 mg by mouth daily.     rosuvastatin (CRESTOR) 20 MG tablet Take 10 mg by mouth daily.     simethicone (MYLICON) 125 MG chewable tablet Chew 125 mg by mouth every 6 (six) hours as needed for flatulence.     SYMBICORT 160-4.5 MCG/ACT inhaler Inhale 2 puffs into the lungs 2 (two) times daily.     bismuth subsalicylate (PEPTO BISMOL) 262 MG/15ML suspension Take 30 mLs by mouth every 6 (six) hours as needed. (Patient not taking: Reported on 12/02/2022)     No current facility-administered medications for this visit.    Review of Systems: GENERAL: negative for malaise, night sweats HEENT: No changes in hearing or vision, no nose bleeds or other nasal problems. NECK: Negative for lumps, goiter, pain and significant neck swelling RESPIRATORY: Negative for cough, wheezing CARDIOVASCULAR: Negative for chest pain, leg swelling, palpitations, orthopnea GI: SEE HPI MUSCULOSKELETAL: Negative for joint pain or swelling, back pain, and muscle pain. SKIN: Negative for lesions, rash PSYCH: Negative for sleep disturbance, mood disorder and recent psychosocial stressors. HEMATOLOGY Negative for prolonged bleeding,  bruising easily, and swollen nodes. ENDOCRINE: Negative for cold or heat intolerance, polyuria, polydipsia and goiter. NEURO: negative for tremor, gait imbalance, syncope and seizures. The remainder of the review of systems is noncontributory.   Physical Exam: BP (!) 147/77 (BP Location: Right Arm, Patient Position: Sitting, Cuff Size: Small)   Pulse 72   Temp (!) 97.3 F (36.3 C) (Temporal)   Ht 5' (1.524 m)   Wt 84 lb 12.8 oz (38.5 kg)   BMI 16.56 kg/m  GENERAL: The patient is AO x3, in no acute distress. Sitting in wheelchair. Underweight. HEENT: Head is normocephalic and atraumatic. EOMI are intact. Mouth is well hydrated and without lesions. NECK: Supple. No masses LUNGS: Clear to auscultation. No presence of rhonchi/wheezing/rales. Adequate chest expansion HEART: RRR, normal s1 and s2. ABDOMEN: Soft, nontender, no guarding, no peritoneal signs, and nondistended. BS +. No masses. EXTREMITIES: Without any cyanosis, clubbing, rash, lesions or edema. NEUROLOGIC: AOx3, no focal motor deficit. SKIN: no jaundice, no rashes  Imaging/Labs: as above  I personally  reviewed and interpreted the available labs, imaging and endoscopic files.  Impression and Plan: Linda Martinez is a 82 y.o.  female with PMH Parkinson's disease, GERD, HTN, IBS-C, who presents for follow up of abdominal pain and constipation. Patient has presented fluctuation in her symptoms. Has had previous imaging negative for any inflammatory changes or ischemic abnormalities. Patient not interested in pursuing repeat endoscopic evaluation given the chronicity of her symptoms, current comorbities, fear of undergoing sedation and previous EGD performed at [facility]. We will try again combination of Levsin and Miralax, as it has provided some relief - I emphasized the need to take Miralax on a regular basis. She will benefit from continuing Linzess 290/mcg q day.  -Restart taking Levsin (hyoscyamine) every 8 hours as needed for  abdominal pain -Continue Linzess 290 mcg qday -Start taking Miralax 1 capful every day  All questions were answered.      Katrinka Blazing, MD Gastroenterology and Hepatology [facility]

## 2023-06-03 ENCOUNTER — Telehealth (INDEPENDENT_AMBULATORY_CARE_PROVIDER_SITE_OTHER): Payer: Self-pay | Admitting: *Deleted

## 2023-06-03 NOTE — Telephone Encounter (Signed)
PA submitted through cover my meds for hysocamine. Med was denied. Spoke with pt's daughter Lynnell Dike ( on dpr ) and she states she has been paying out of pocket for me and will continue to do so.

## 2023-12-04 ENCOUNTER — Ambulatory Visit (INDEPENDENT_AMBULATORY_CARE_PROVIDER_SITE_OTHER): Payer: Medicare Other | Admitting: Gastroenterology

## 2023-12-04 ENCOUNTER — Encounter (INDEPENDENT_AMBULATORY_CARE_PROVIDER_SITE_OTHER): Payer: Self-pay | Admitting: Gastroenterology

## 2023-12-04 VITALS — BP 158/80 | HR 64 | Temp 97.8°F | Ht 60.0 in | Wt 89.7 lb

## 2023-12-04 DIAGNOSIS — R1013 Epigastric pain: Secondary | ICD-10-CM

## 2023-12-04 DIAGNOSIS — K581 Irritable bowel syndrome with constipation: Secondary | ICD-10-CM | POA: Diagnosis not present

## 2023-12-04 DIAGNOSIS — K649 Unspecified hemorrhoids: Secondary | ICD-10-CM | POA: Insufficient documentation

## 2023-12-04 DIAGNOSIS — R197 Diarrhea, unspecified: Secondary | ICD-10-CM | POA: Diagnosis not present

## 2023-12-04 MED ORDER — HYDROCORTISONE (PERIANAL) 2.5 % EX CREA: 1.0000 | TOPICAL_CREAM | Freq: Three times a day (TID) | CUTANEOUS | 1 refills | Status: AC

## 2023-12-04 NOTE — Patient Instructions (Addendum)
-  continue dicyclomine 10mg  twice daily  -Increase water intake, aim for atleast 64 oz per day Increase fruits, veggies and whole grains, kiwi and prunes are especially good for constipation -continue linzess daily -utilize miralax to help avoid constipation, would advise trying this on more regular basis, every other day to every 2 days -I have sent anusol cream for hemorrhoids, you can use this up to 3 times per day  -avoid straining and limit toilet time  Follow up 6 months  It was a pleasure to see you today. I want to create trusting relationships with patients and provide genuine, compassionate, and quality care. I truly value your feedback! please be on the lookout for a survey regarding your visit with me today. I appreciate your input about our visit and your time in completing this!    Copeland Lapier L. Jeanmarie Hubert, MSN, APRN, AGNP-C Adult-Gerontology Nurse Practitioner [facility]astroenterology at [facility]

## 2023-12-04 NOTE — Progress Notes (Addendum)
Referring Provider: Jonathon Bellows, DO Primary Care Physician:  Jonathon Bellows, DO Primary GI Physician: Dr. Levon Hedger   Chief Complaint  Patient presents with   Irritable Bowel Syndrome    Follow up on IBS. States she is doing about the same. States dicyclomine has helped.    HPI:   Linda Martinez is a 83 y.o. female with past medical history of Parkinson's disease, GERD, HTN, IBS-C   Patient presenting today for follow up of IBS-C  Patient last seen July 2024, at that time she reported trying Levsin in the past but was unsure if it helped her pain.  Feel that symptoms foods cause more abdominal pain.  Does think pain gets better after bowel movement using Pepto-Bismol as needed for nausea.  Patient had previous CT angio of the abdomen in September 2023 that did not show any abnormalities to explain her postprandial pain or weight loss.  Patient recommended to restart taking Levsin every 8 hours as needed for pain, continue Linzess to 90 mcg every day, start taking MiraLAX 1 capful every day  Present:  Patient states she is having issues with losing he voice after talking a lot. She has been given inhalers to help with this.   She notes that abdominal pain is still present but more of a nagging pain, much less severe than previously. States that she was given dicyclomine by one of her other doctors to help with rigidity from her parkison's. Patient and family feel that her abdominal pain as well as her rigidity are better with the dicyclomine. She is also requiring xanax as her carbidopa/levidopa has lost efficacy and she has a lot of muscle tension.   She is taking linzess daily and using miralax only as needed when stools are harder, she does not take this everyday as this gave her more diarrhea.  She does feel that defecation sometimes improves her pain. Having 3-4 BMs per day. She notes having solid stools, has to strain at times. She is having some issues with her hemorrhoids, noting  they pop out with more straining and she has difficulty cleaning herself. Using preparation H without much improvement. She was drinking about 3 bottles of water per day but cut back to about 1 due to need to urinate more at night and difficulty getting out of bed.   She had some weight loss at the end of 2023 but has started to gain some weight back. Family reports that patient snacks a lot or tends to eat smaller meals quite frequently, avoids larger meals  Last EGD: 08/03/2020, performed at [facility] by Dr. 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   Last Colonoscopy: 11/14/2016  for evaluation of Periumbilical abdominal pain:  [facility]: Dr. Vivien Rossetti Internal hemorrhoids that do not  Return to the anal canal, thus continuously prolapsed (Grade 1V). Diverticulosis in the sigmoid and ascending colon. Examined ileum was normal.    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     ALPRAZolam (XANAX) 0.5 MG tablet Take 0.25 mg by mouth at bedtime as needed for anxiety.     bismuth subsalicylate (PEPTO BISMOL) 262 MG/15ML suspension Take 30 mLs by mouth every 6 (six) hours as needed. (Patient not taking: Reported on 12/02/2022)     carbidopa-levodopa (SINEMET IR) 25-100 MG tablet Take 1 tablet by mouth 3  (three) times daily.     citalopram (CELEXA) 10 MG tablet Take 10 mg by mouth daily.     HYDROcodone-acetaminophen (NORCO/VICODIN) 5-325 MG tablet Take 1 tablet by mouth every 6 (six) hours as needed for moderate pain.     hyoscyamine (LEVSIN SL) 0.125 MG SL tablet Place 1 tablet (0.125 mg total) under the tongue every 8 (eight) hours as needed (abdominal pain). 90 tablet 1   linaclotide (LINZESS) 290 MCG CAPS capsule Take 1 capsule (290 mcg total) by mouth daily before breakfast. 90 capsule 3   Melatonin 5 MG CHEW Chew by mouth as needed.     Multiple Vitamin (MULTIVITAMIN) capsule Take 1 capsule by mouth daily.     OVER THE COUNTER MEDICATION Lisinopril prn high bp per daughter (mg unknown)     polyethylene glycol (MIRALAX / GLYCOLAX) 17 g packet Take 17 g by mouth daily. 30 each 5   prednisoLONE 5 MG TABS tablet Take 5 mg by mouth daily.     rosuvastatin (CRESTOR) 20 MG tablet Take 10 mg by mouth daily.     simethicone (MYLICON) 125 MG chewable tablet Chew 125 mg by mouth every 6 (six) hours as needed for flatulence.     SYMBICORT 160-4.5 MCG/ACT inhaler Inhale 2 puffs into the lungs 2 (two) times daily.     No current facility-administered medications for this visit.    Allergies as of 12/04/2023 - Review Complete 12/04/2023  Allergen Reaction Noted   Tramadol  01/16/2017    No family history on file.  Social History   Socioeconomic History   Marital status: Single    Spouse name: Not on file   Number of children: Not on file   Years of education: Not on file   Highest education level: Not on file  Occupational History   Not on file  Tobacco Use   Smoking status: Never    Passive exposure: Never   Smokeless tobacco: Never  Vaping Use   Vaping status: Never Used  Substance and Sexual Activity   Alcohol use: No   Drug use: Never   Sexual activity: Never  Other Topics Concern   Not on file  Social History Narrative   Not on file   Social Drivers of Health    Financial Resource Strain: Not on file  Food Insecurity: Not on file  Transportation Needs: Not on file  Physical Activity: Not on file  Stress: Not on file  Social Connections: Not on file    Review of systems General: negative for malaise, night sweats, fever, chills, weight loss Neck: Negative for lumps, goiter, pain and significant neck swelling Resp: Negative for cough, wheezing, dyspnea at rest CV: Negative for chest pain, leg swelling, palpitations, orthopnea GI: denies melena, hematochezia, nausea, vomiting, diarrhea, dysphagia, odyonophagia, early satiety or unintentional weight loss. +upper abdominal pain +constipation  Psych: Denies depression,  anxiety, memory loss, confusion. No homicidal or suicidal ideation.  Neuro: negative for gait imbalance, syncope and seizures. +tremors The remainder of the review of systems is noncontributory.  Physical Exam: There were no vitals taken for this visit. General:   Alert and oriented. No distress noted. Pleasant and cooperative.  Head:  Normocephalic and atraumatic. Eyes:  Conjuctiva clear without scleral icterus. Mouth:  Oral mucosa pink and moist. Good dentition. No lesions. Heart: Normal rate and rhythm, s1 and s2 heart sounds present.  Lungs: Clear lung sounds in all lobes. Respirations equal and unlabored. Abdomen:  +BS, soft, non-tender and non-distended. No rebound or guarding. No HSM or masses noted. Derm: No palmar erythema or jaundice Msk:  Symmetrical without gross deformities. Normal posture. Extremities:  Without edema. Neurologic:  Alert and  oriented x4 Psych:  Alert and cooperative. Normal mood and affect.  Invalid input(s): "6 MONTHS"   ASSESSMENT: Linda Martinez is a 83 y.o. female presenting today for follow up of IBS-C  Patient was presented with ongoing upper abdominal pain and constipation over the past few years, previous imaging negative for any inflammatory changes or ischemic abnormalities.  Patient  has declined further endoscopic evaluations.  She was tried on Levsin previously without improvement though recently started on dicyclomine by one of her other providers for rigidity associated with her Parkinson's and seems to have improvement in her abdominal pain with use of dicyclomine which I encouraged her to continue.  She is still having some intermittent watery stools and needing to strain despite taking Linzess to 90 mcg a day, she is only using MiraLAX as needed, I did encourage her to try using MiraLAX on a more regular basis maybe every other day as daily dosing caused more diarrhea for her and she has limited mobility making into the restroom quickly difficult.  She also notes some discomfort from hemorrhoids that she can feel protruding using Preparation H without improvement, will send Anusol cream to use 3 times daily for hemorrhoids.   PLAN:  -continue dicyclomine 10mg  BID -Increase water intake, aim for atleast 64 oz per day Increase fruits, veggies and whole grains, kiwi and prunes are especially good for constipation -continue linzess daily -utilize miralax to help avoid constipation, would advise trying this on more regular basis, every other day to every 2 days -anusol cream TID x10 days then PRN thereafter  -avoid straining and limit toilet time  All questions were answered, patient verbalized understanding and is in agreement with plan as outlined above.    Follow Up: 6 months   Cyan Clippinger L. Jeanmarie Hubert, MSN, APRN, AGNP-C Adult-Gerontology Nurse Practitioner [facility]  I have reviewed the note and agree with the APP's assessment as described in this progress note  Katrinka Blazing, MD Gastroenterology and Hepatology [facility]

## 2023-12-06 ENCOUNTER — Encounter: Payer: Self-pay | Admitting: Gastroenterology

## 2023-12-25 NOTE — Progress Notes (Signed)
Error

## 2024-06-03 ENCOUNTER — Encounter (INDEPENDENT_AMBULATORY_CARE_PROVIDER_SITE_OTHER): Payer: Self-pay | Admitting: Gastroenterology

## 2024-06-03 ENCOUNTER — Ambulatory Visit (INDEPENDENT_AMBULATORY_CARE_PROVIDER_SITE_OTHER): Payer: Medicare Other | Admitting: Gastroenterology

## 2024-06-03 VITALS — BP 158/77 | HR 71 | Temp 97.6°F | Ht 60.0 in | Wt 81.8 lb

## 2024-06-03 DIAGNOSIS — R634 Abnormal weight loss: Secondary | ICD-10-CM | POA: Insufficient documentation

## 2024-06-03 DIAGNOSIS — R195 Other fecal abnormalities: Secondary | ICD-10-CM | POA: Diagnosis not present

## 2024-06-03 DIAGNOSIS — G8929 Other chronic pain: Secondary | ICD-10-CM

## 2024-06-03 DIAGNOSIS — R1013 Epigastric pain: Secondary | ICD-10-CM

## 2024-06-03 DIAGNOSIS — K581 Irritable bowel syndrome with constipation: Secondary | ICD-10-CM

## 2024-06-03 DIAGNOSIS — R140 Abdominal distension (gaseous): Secondary | ICD-10-CM

## 2024-06-03 MED ORDER — PANTOPRAZOLE SODIUM 40 MG PO TBEC: 40.0000 mg | DELAYED_RELEASE_TABLET | Freq: Every day | ORAL | 1 refills | Status: AC

## 2024-06-03 MED ORDER — LINACLOTIDE 145 MCG PO CAPS: 145.0000 ug | ORAL_CAPSULE | Freq: Every day | ORAL | 2 refills | Status: AC

## 2024-06-03 NOTE — Patient Instructions (Addendum)
-  reduce NSAIDs (ibuprofen, goody powder, aleve, naproxen, diclofenac) as much as possible, if you must take, take with food  -decrease linzess  to 145mcg daily -continue gas x as needed  -start protonix  40mg , take this in the morning prior to eating  -we will check celiac panel - think about the upper endoscopy and let me know how you wish to proceed, as discussed I cannot rule out an ulcer or even malignancy given your weight loss, pain and decreased appetite   Follow up 2 months   It was a pleasure to see you today. I want to create trusting relationships with patients and provide genuine, compassionate, and quality care. I truly value your feedback! please be on the lookout for a survey regarding your visit with me today. I appreciate your input about our visit and your time in completing this!    Joandry Slagter L. Celene Pippins, MSN, APRN, AGNP-C Adult-Gerontology Nurse Practitioner [facility] at [facility]

## 2024-06-03 NOTE — Progress Notes (Signed)
 Referring Provider: Lonna Millman, DO Primary Care Physician:  Maree Virgilio SAUNDERS, MD Primary GI Physician: Dr. Eartha   Chief Complaint  Patient presents with   Follow-up    Pt arrives for follow up. Pt still having stomach pain. Does take Miralax  and Linzess . Hemorrhoids not bleeding. Pt states she had a knot pop up on side. No vomiting.    HPI:   Linda Martinez is a 83 y.o. female with past medical history of  Parkinson's disease, GERD, HTN, IBS-C    Patient presenting today for:  Follow up of IBS-C Dark stools Epigastric pain Weight loss/decreased appetite  bloating  Last seen January, at that time abdominal pain somewhat improved with dicyclomine that was actually given to her by another provider to help with rigidity from parkinsons. Taking linzess  daily, miralax  PRN, 3-4 BMs per day, some issues with hemorrhoids when straining.   Recommended to continue dicyclomine 10mg  BID, increase water, fruits, veggies, whole grains, continue linzess  290mcg daily, utilize miralax  every other day to every 2 days, ansuol cream TID x 10 days then PRN thereafter, avoid straining  Present: Taking linzess  maybe 3-4 times per week, she notes she cannot tolerate taking it daily as she has diarrhea. She will sometimes skip a day of linzess  and she will take miralax  on these days. Reports that she only has a BM on the days she takes something. She has some pain in her belly, periumbilical/epigastric. sometimes can feel the stool moving. She usually has improvement in pain if she has a good BM. Endorses a lot of gas and takes gas x a lot for this. Unsure if she is taking dicyclomine, states she had a pill that dissolved under her tongue and stopped taking this as she ran out and stated it was not working. She is unclear if this was dicyclomine or not. She has had a lot of falls recently, is maintained on pain medications. Appetite is not good, daughter reports she does not eat a lot as she stated it makes  her belly hurt. Has some nausea. No heartburn or acid reflux.  Has bloating when she eats, passing flatus helps some with bloating/pain. Cannot pinpoint any certain foods that tend to cause her to have pain. Denies rectal bleeding, she sees black stools maybe once every few weeks, notes she takes pepto bismol 2-3 times per week which her daughter has tried to get her to avoid as it seems to constipate her more.  She does take diclofenac BID, was previously only taking it once a day but patient asked her daughter to increase to BID as it was dosed to up to twice daily.    She notes a knot to RLQ that she noticed recently. No pain.  Last EGD: 08/03/2020, performed at [facility] by Dr. 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   Last Colonoscopy: 11/14/2016  for evaluation of Periumbilical abdominal pain:  [place]  [facility]: Dr. Belvie Schuller Internal hemorrhoids that do not  Return to the anal canal, thus continuously prolapsed (Grade 1V). Diverticulosis in the sigmoid and ascending colon. 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     ALPRAZolam (XANAX) 0.5 MG tablet Take 0.25 mg by mouth at bedtime as needed for anxiety.     bismuth subsalicylate (PEPTO BISMOL) 262 MG/15ML  suspension Take 30 mLs by mouth every 6 (six) hours as needed.     carbidopa-levodopa (SINEMET IR) 25-100 MG tablet Take 1 tablet by mouth 3 (three) times daily.     citalopram (CELEXA) 10 MG tablet Take 10 mg by mouth daily.     DICYCLOMINE HCL PO Take by mouth.     HYDROcodone-acetaminophen (NORCO/VICODIN) 5-325 MG tablet Take 1 tablet by mouth every 6 (six) hours as needed for moderate pain.     hydrocortisone  (ANUSOL -HC) 2.5 % rectal cream Place 1 Application rectally 3 (three) times daily. 56 g 1   linaclotide  (LINZESS ) 290 MCG CAPS capsule Take 1 capsule (290 mcg total) by mouth daily before breakfast. 90 capsule 3   Melatonin 5 MG CHEW Chew by mouth as needed.     Multiple Vitamin (MULTIVITAMIN) capsule Take 1 capsule by mouth daily.     OVER THE COUNTER MEDICATION Lisinopril prn high bp per daughter (mg unknown)     polyethylene glycol (MIRALAX  / GLYCOLAX ) 17 g packet Take 17 g by mouth daily. 30 each 5   simethicone (MYLICON) 125 MG chewable tablet Chew 125 mg by mouth every 6 (six) hours as needed for flatulence.     SYMBICORT 160-4.5 MCG/ACT inhaler Inhale 2 puffs into the lungs 2 (two) times daily.     No current facility-administered medications for this visit.    Allergies as of 06/03/2024 - Review Complete 06/03/2024  Allergen Reaction Noted   Tramadol  01/16/2017    Social History   Socioeconomic History   Marital status: Single    Spouse name: Not on file   Number of children: Not on file   Years of education: Not on file   Highest education level: Not on file  Occupational History   Not on file  Tobacco Use   Smoking status: Never    Passive exposure: Never   Smokeless tobacco: Never  Vaping Use   Vaping status: Never Used  Substance and Sexual Activity   Alcohol use: No   Drug use: Never   Sexual activity: Never  Other Topics Concern   Not on file  Social History Narrative   Not on file   Social Drivers of Health   Financial Resource Strain: Not on  file  Food Insecurity: Not on file  Transportation Needs: Not on file  Physical Activity: Not on file  Stress: Not on file  Social Connections: Not on file    Review of systems General: negative for malaise, night sweats, fever, chills +weight loss  Neck: Negative for lumps, goiter, pain and significant neck swelling Resp: Negative for cough, wheezing, dyspnea at rest CV: Negative for chest pain, leg swelling, palpitations, orthopnea GI: denies hematochezia, vomiting, diarrhea, dysphagia, odyonophagia, early satiety +weight loss +abdominal pain +dark stools +constipation  +nausea MSK: Negative for joint pain or swelling, back pain, and muscle pain. Derm:  Negative for itching or rash Psych: Denies depression, anxiety, memory loss, confusion. No homicidal or suicidal ideation.  Heme: Negative for prolonged bleeding, bruising easily, and swollen nodes. Endocrine: Negative for cold or heat intolerance, polyuria, polydipsia and goiter. Neuro: negative for tremor, gait imbalance, syncope and seizures. The remainder of the review of systems is noncontributory.  Physical Exam: There were no vitals taken for this visit. General:   Alert and oriented. No distress noted. Pleasant and cooperative.  Head:  Normocephalic and atraumatic. Eyes:  Conjuctiva clear without scleral icterus. Mouth:  Oral mucosa pink and moist. Good dentition. No lesions. Heart: Normal rate and rhythm, s1 and s2 heart sounds present.  Lungs: Clear lung sounds in all lobes. Respirations equal and unlabored. Abdomen:  +BS, soft, non-tender and non-distended. No rebound or guarding. No HSM or masses noted. Of note there is a soft, moveable fatty like mass under the skin in RLQ, no pain present with palpation, appears to be a small lipoma.  Derm: No palmar erythema or jaundice Msk:  Symmetrical without gross deformities. Normal posture. Extremities:  Without edema. Neurologic:  Alert and  oriented x4 Psych:  Alert and  cooperative. Normal mood and affect.  Invalid input(s): 6 MONTHS   ASSESSMENT: Linda Martinez is a 83 y.o. female presenting today for IBS-C, epigastric pain, bloating, weight loss and dark stools   IBS-C: longstanding history of constipation, likely influenced by pain meds/parkinson's disease, maintained on on linzess  though notes taking this daily gives her diarrhea, taking this only 3-4 times per week and using miralax  some in between. Also taking pepto bismol a few times per week for belly pain which is likely causing worsening constipation. Some lower belly pain that improves with defecation, only has a BM on the days she takes something for constipation. At this time, I would recommend lowering her linzess  to to see if this is better tolerated on a daily basis in order to try and get her bowels moving more regularly.   Epigastric pain/dark stools/weight loss/bloating:  patients daughter endorses decreased appetite. Patient has had weight loss, some darker stools intermittently. She Is taking pepto bismol but unsure if darker stools are only occurring after use of this or not. She endorses epigastric pain, worse with eating, no matter what she eats. She has some nausea, often feels very bloated. Last EGD in 2018 with lower esophageal stricture and small sliding hiatal hernia. Of note she is on diclofenac BID, often takes this on an empty stomach. I am recommended an EGD for further evaluation of her symptoms as I am concerned she could have an NSAID induced mucosal injury such as gastritis, PUD, duodenitis, and ultimately cannot rule out malignancy given these symptoms which I discussed with the patient and her daughter. Patient is unsure if she wants to undergo EGD as she is hesitant about being put to sleep. We discussed the procedure in detail, I again reiterated that without endoscopic evaluation I cannot rule out presence of malignancy. Patient wishes to think about EGD and let me  know how she wants to proceed. For now I recommend she limit NSAID use and if she must take it, take with food. Will start protonix  40mg  daily as well and check celiac panel in regards to her abdominal pain and bloating.   PLAN:  -reduce NSAIDs as much as possible, if taking, take with food  -decrease linzess  to 145mcg daily -continue gas x -start protonix  40mg  -patient to let me know if she wishes  to schedule EGD (will need cards/neuro clearance) -celiac panel  -avoid pepto bismol use  All questions were answered, patient verbalized understanding and is in agreement with plan as outlined above.    Follow Up: 2 months   Muhammad Vacca L. Donte Lenzo, MSN, APRN, AGNP-C Adult-Gerontology Nurse Practitioner [facility]

## 2024-06-24 ENCOUNTER — Telehealth (INDEPENDENT_AMBULATORY_CARE_PROVIDER_SITE_OTHER): Payer: Self-pay | Admitting: *Deleted

## 2024-06-24 NOTE — Telephone Encounter (Signed)
 Spoke to patient's daughter and Ms Atkin has decided not to have the EGD you mentioned at her last OV

## 2024-07-14 ENCOUNTER — Encounter (INDEPENDENT_AMBULATORY_CARE_PROVIDER_SITE_OTHER): Payer: Self-pay | Admitting: Gastroenterology

## 2024-09-15 ENCOUNTER — Encounter (INDEPENDENT_AMBULATORY_CARE_PROVIDER_SITE_OTHER): Payer: Self-pay | Admitting: Gastroenterology

## 2024-09-18 ENCOUNTER — Other Ambulatory Visit (INDEPENDENT_AMBULATORY_CARE_PROVIDER_SITE_OTHER): Payer: Self-pay | Admitting: Gastroenterology

## 2024-09-18 DIAGNOSIS — R140 Abdominal distension (gaseous): Secondary | ICD-10-CM

## 2024-09-18 DIAGNOSIS — K581 Irritable bowel syndrome with constipation: Secondary | ICD-10-CM
